# Patient Record
Sex: Male | Born: 1970 | Race: White | Hispanic: No | Marital: Single | Smoking: Never smoker
Health system: Southern US, Community
[De-identification: ages and names within clinical notes are randomized; demographics above are authoritative.]

## PROBLEM LIST (undated history)

## (undated) DIAGNOSIS — B372 Candidiasis of skin and nail: Secondary | ICD-10-CM

## (undated) DIAGNOSIS — K219 Gastro-esophageal reflux disease without esophagitis: Secondary | ICD-10-CM

## (undated) DIAGNOSIS — F840 Autistic disorder: Secondary | ICD-10-CM

## (undated) DIAGNOSIS — L700 Acne vulgaris: Secondary | ICD-10-CM

## (undated) DIAGNOSIS — L853 Xerosis cutis: Secondary | ICD-10-CM

## (undated) DIAGNOSIS — B356 Tinea cruris: Secondary | ICD-10-CM

## (undated) DIAGNOSIS — I959 Hypotension, unspecified: Secondary | ICD-10-CM

## (undated) DIAGNOSIS — R066 Hiccough: Secondary | ICD-10-CM

## (undated) DIAGNOSIS — T8859XA Other complications of anesthesia, initial encounter: Secondary | ICD-10-CM

## (undated) DIAGNOSIS — I10 Essential (primary) hypertension: Secondary | ICD-10-CM

## (undated) DIAGNOSIS — F73 Profound intellectual disabilities: Secondary | ICD-10-CM

## (undated) DIAGNOSIS — H269 Unspecified cataract: Secondary | ICD-10-CM

## (undated) DIAGNOSIS — E079 Disorder of thyroid, unspecified: Secondary | ICD-10-CM

## (undated) DIAGNOSIS — E039 Hypothyroidism, unspecified: Secondary | ICD-10-CM

## (undated) HISTORY — PX: DENTAL SURGERY: SHX609

## (undated) HISTORY — PX: TONSILLECTOMY: SUR1361

---

## 1997-12-03 ENCOUNTER — Other Ambulatory Visit: Admission: RE | Admit: 1997-12-03 | Discharge: 1997-12-03 | Payer: Self-pay | Admitting: Internal Medicine

## 1998-03-24 ENCOUNTER — Other Ambulatory Visit: Admission: RE | Admit: 1998-03-24 | Discharge: 1998-03-24 | Payer: Self-pay | Admitting: Otolaryngology

## 1999-04-18 ENCOUNTER — Encounter: Payer: Self-pay | Admitting: Emergency Medicine

## 1999-04-18 ENCOUNTER — Emergency Department (HOSPITAL_COMMUNITY): Admission: EM | Admit: 1999-04-18 | Discharge: 1999-04-18 | Payer: Self-pay | Admitting: Emergency Medicine

## 2000-07-03 ENCOUNTER — Emergency Department (HOSPITAL_COMMUNITY): Admission: EM | Admit: 2000-07-03 | Discharge: 2000-07-03 | Payer: Self-pay | Admitting: Internal Medicine

## 2000-08-15 ENCOUNTER — Inpatient Hospital Stay (HOSPITAL_COMMUNITY): Admission: EM | Admit: 2000-08-15 | Discharge: 2000-08-18 | Payer: Self-pay

## 2000-08-30 ENCOUNTER — Emergency Department (HOSPITAL_COMMUNITY): Admission: EM | Admit: 2000-08-30 | Discharge: 2000-08-30 | Payer: Self-pay | Admitting: *Deleted

## 2002-07-15 ENCOUNTER — Ambulatory Visit (HOSPITAL_COMMUNITY): Admission: RE | Admit: 2002-07-15 | Discharge: 2002-07-15 | Payer: Self-pay | Admitting: Specialist

## 2002-07-15 ENCOUNTER — Encounter: Payer: Self-pay | Admitting: Specialist

## 2002-09-09 ENCOUNTER — Emergency Department (HOSPITAL_COMMUNITY): Admission: EM | Admit: 2002-09-09 | Discharge: 2002-09-09 | Payer: Self-pay | Admitting: Emergency Medicine

## 2002-10-06 ENCOUNTER — Encounter: Payer: Self-pay | Admitting: Emergency Medicine

## 2002-10-06 ENCOUNTER — Emergency Department (HOSPITAL_COMMUNITY): Admission: EM | Admit: 2002-10-06 | Discharge: 2002-10-06 | Payer: Self-pay | Admitting: Emergency Medicine

## 2004-09-06 ENCOUNTER — Ambulatory Visit: Payer: Self-pay | Admitting: Gastroenterology

## 2004-09-14 ENCOUNTER — Ambulatory Visit (HOSPITAL_COMMUNITY): Admission: RE | Admit: 2004-09-14 | Discharge: 2004-09-14 | Payer: Self-pay | Admitting: Gastroenterology

## 2006-06-07 ENCOUNTER — Emergency Department (HOSPITAL_COMMUNITY): Admission: EM | Admit: 2006-06-07 | Discharge: 2006-06-07 | Payer: Self-pay | Admitting: Emergency Medicine

## 2006-06-14 ENCOUNTER — Ambulatory Visit: Payer: Self-pay | Admitting: Gastroenterology

## 2006-06-21 ENCOUNTER — Ambulatory Visit (HOSPITAL_COMMUNITY): Admission: RE | Admit: 2006-06-21 | Discharge: 2006-06-21 | Payer: Self-pay | Admitting: Gastroenterology

## 2006-06-26 ENCOUNTER — Ambulatory Visit: Payer: Self-pay | Admitting: Gastroenterology

## 2006-06-26 ENCOUNTER — Emergency Department (HOSPITAL_COMMUNITY): Admission: EM | Admit: 2006-06-26 | Discharge: 2006-06-26 | Payer: Self-pay | Admitting: Family Medicine

## 2007-08-01 IMAGING — US US ABDOMEN COMPLETE
1 series · 14 of 25 positions shown · non-contrast
Comparison: none

CLINICAL DATA: Abdominal pain.  
 ABDOMEN ULTRASOUND:
TECHNIQUE: Complete abdominal ultrasound examination was performed including evaluation of the liver, gallbladder, bile ducts, pancreas, kidneys, spleen, IVC, and abdominal aorta.

[Series 1: unknown · 0.38mm/px · 14 of 65 slices shown]
[im 1/65]
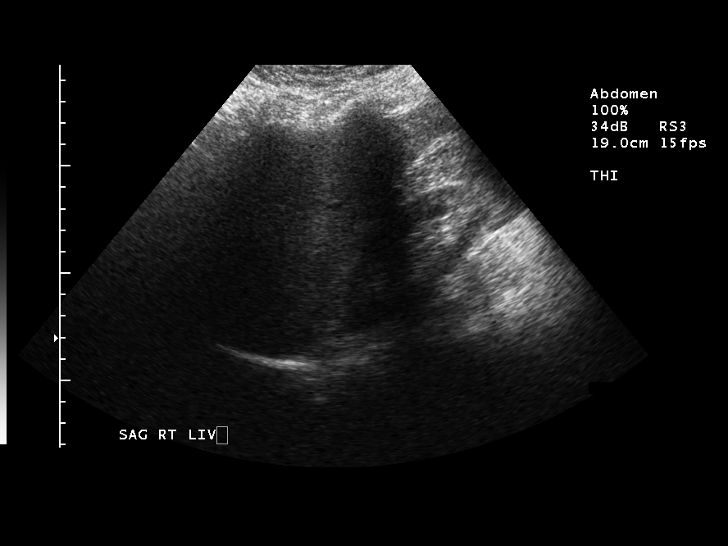
[im 6/65]
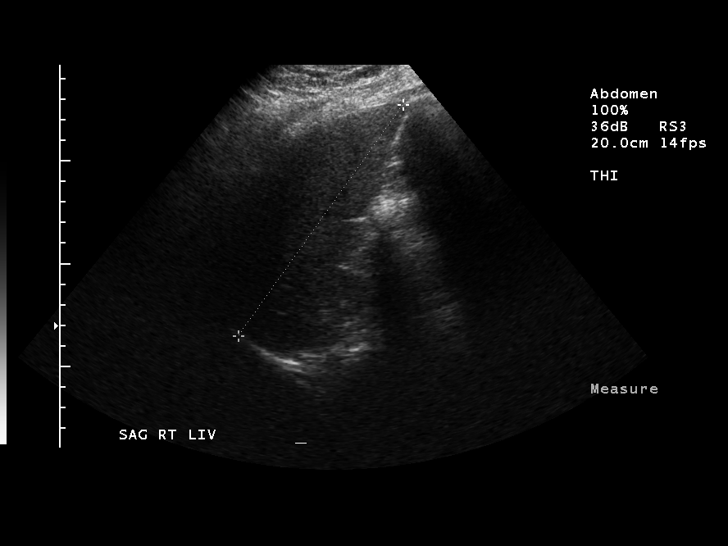
[im 11/65]
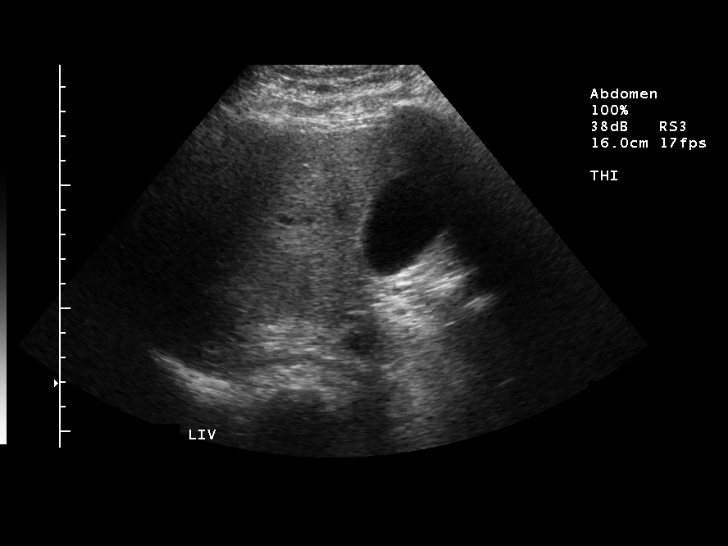
[im 17/65]
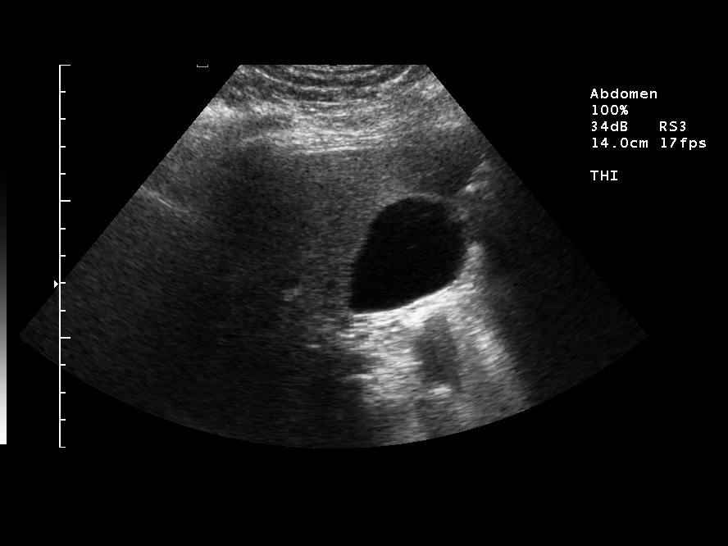
[im 22/65]
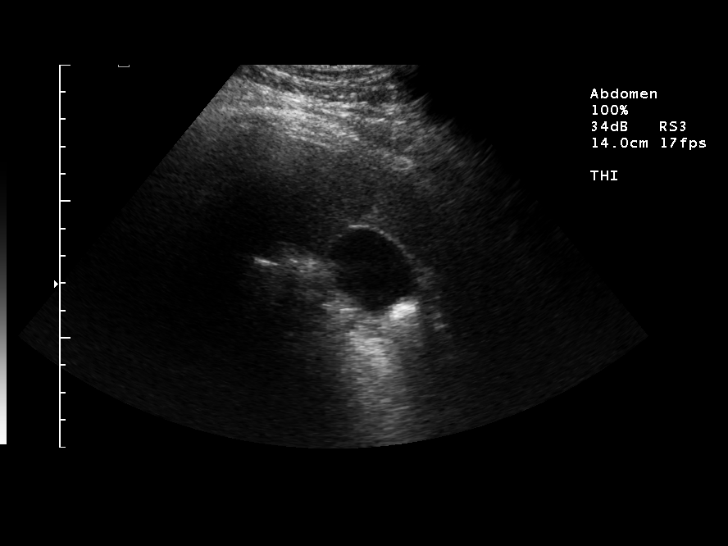
[im 25/65]
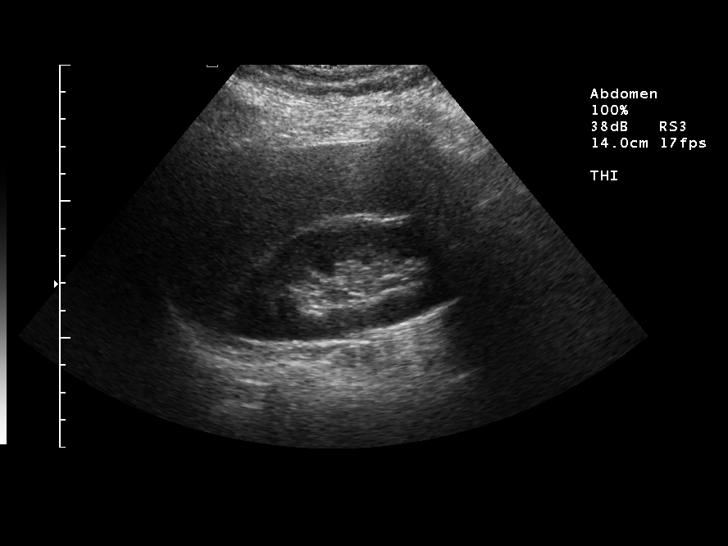
[im 30/65]
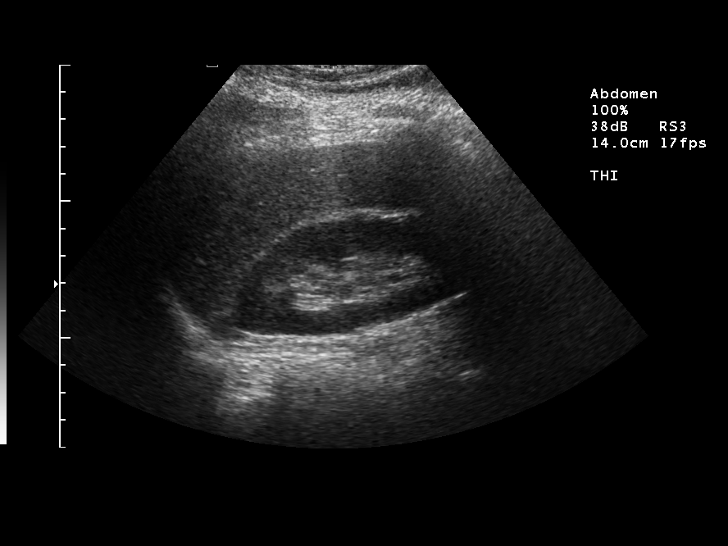
[im 35/65]
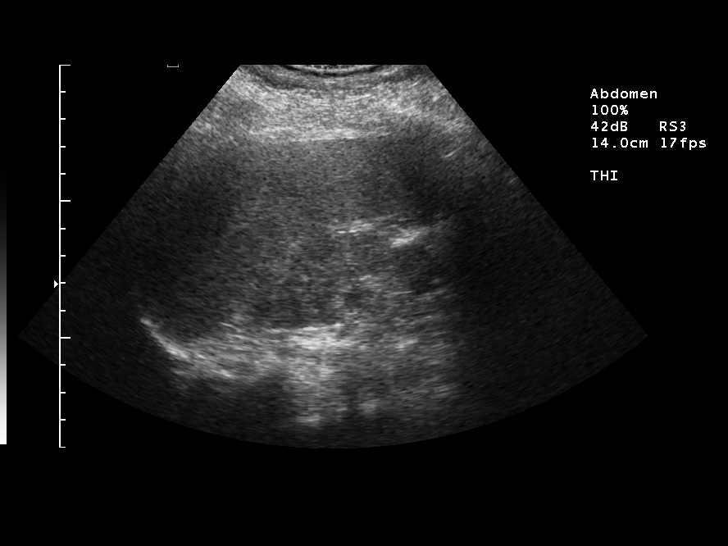
[im 41/65]
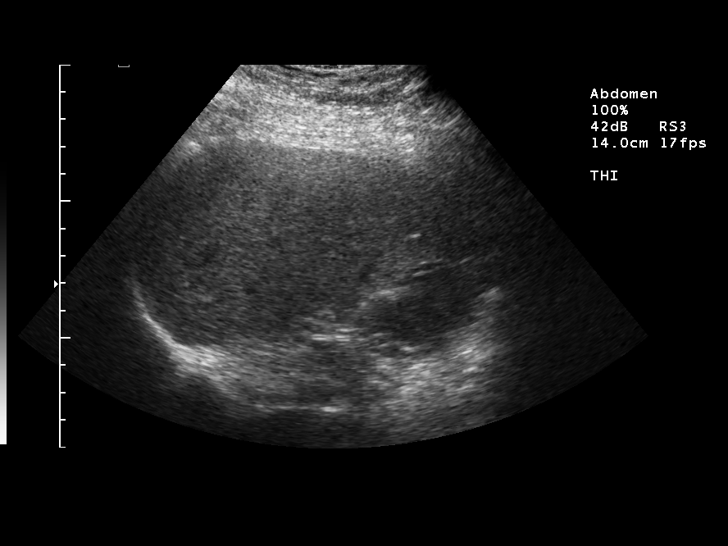
[im 43/65]
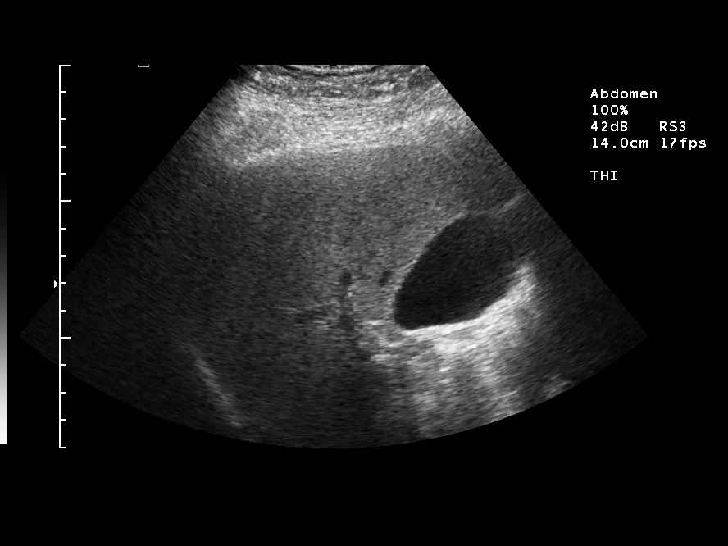
[im 49/65]
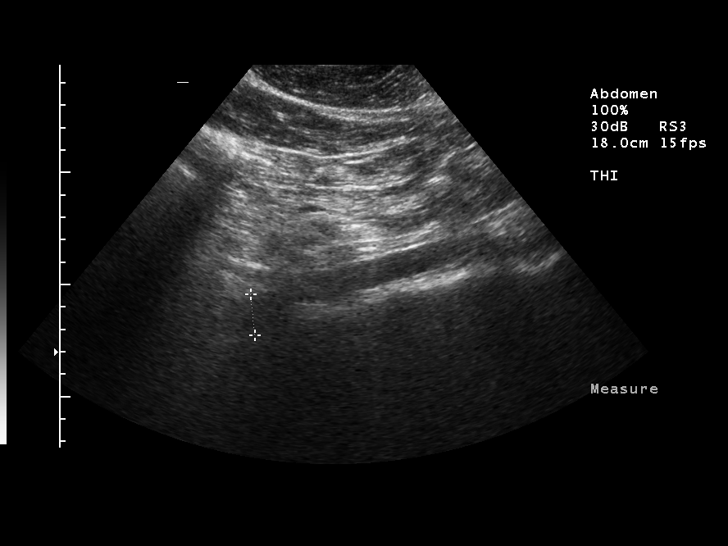
[im 54/65]
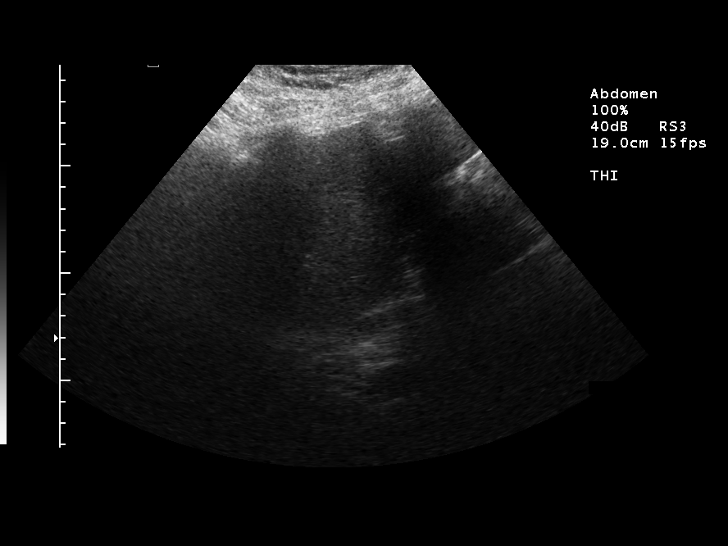
[im 59/65]
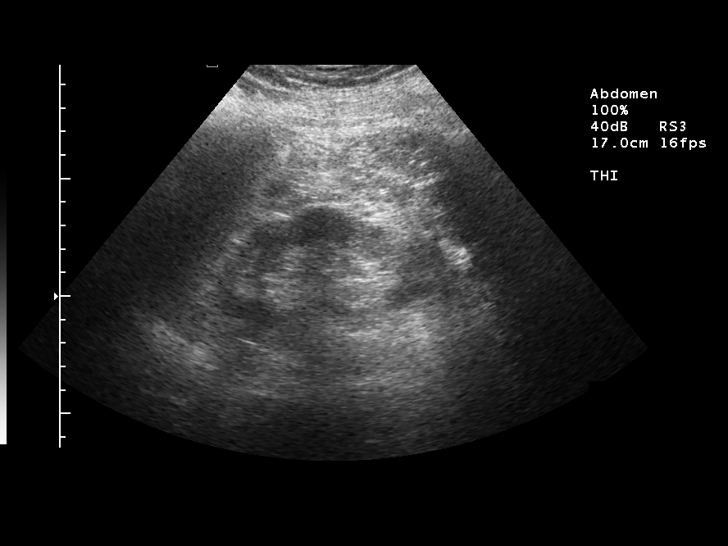
[im 65/65]
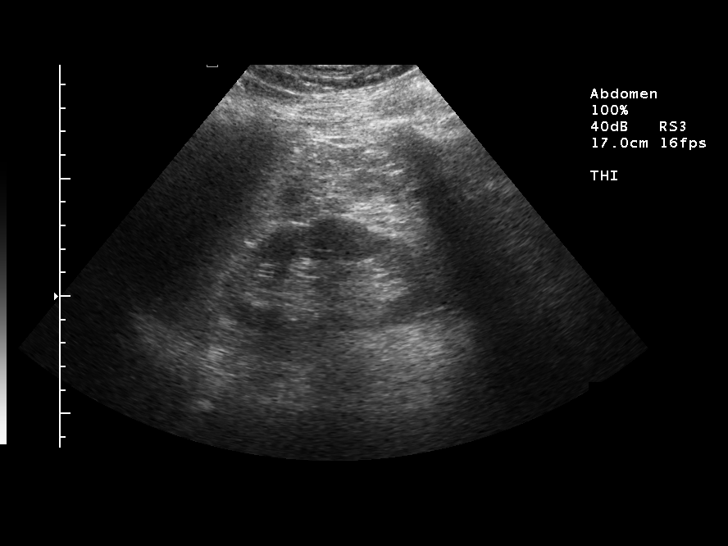

[14 of 25 positions shown; findings below may reference images not displayed]

FINDINGS: Liver, gallbladder, extrahepatic bile duct unremarkable.  Visualization of the IVC is limited.  Pancreas is not visualized.  The spleen, kidneys, and aorta are unremarkable.  No free fluid.
IMPRESSION: No acute findings.

## 2015-02-01 ENCOUNTER — Encounter (HOSPITAL_COMMUNITY): Payer: Self-pay | Admitting: Emergency Medicine

## 2015-02-01 ENCOUNTER — Emergency Department (HOSPITAL_COMMUNITY)
Admission: EM | Admit: 2015-02-01 | Discharge: 2015-02-01 | Disposition: A | Discharge status: Home / Self Care | Payer: Medicare Other | Attending: Emergency Medicine | Admitting: Emergency Medicine

## 2015-02-01 DIAGNOSIS — K219 Gastro-esophageal reflux disease without esophagitis: Secondary | ICD-10-CM | POA: Diagnosis not present

## 2015-02-01 DIAGNOSIS — R066 Hiccough: Secondary | ICD-10-CM | POA: Insufficient documentation

## 2015-02-01 DIAGNOSIS — Z8639 Personal history of other endocrine, nutritional and metabolic disease: Secondary | ICD-10-CM | POA: Insufficient documentation

## 2015-02-01 HISTORY — DX: Gastro-esophageal reflux disease without esophagitis: K21.9

## 2015-02-01 HISTORY — DX: Disorder of thyroid, unspecified: E07.9

## 2015-02-01 MED ORDER — GI COCKTAIL ~~LOC~~
30.0000 mL | Freq: Once | ORAL | Status: AC
  Administered 2015-02-01: 30 mL via ORAL
  Filled 2015-02-01: qty 30

## 2015-02-01 MED ORDER — PANTOPRAZOLE SODIUM 20 MG PO TBEC: 20.0000 mg | DELAYED_RELEASE_TABLET | Freq: Every day | ORAL | Status: DC

## 2015-02-01 MED ORDER — CHLORPROMAZINE HCL 25 MG PO TABS: 25.0000 mg | ORAL_TABLET | Freq: Three times a day (TID) | ORAL | Status: AC | PRN

## 2015-02-01 MED ORDER — SODIUM CHLORIDE 0.9 % IV SOLN
25.0000 mg | Freq: Once | INTRAVENOUS | Status: AC
  Administered 2015-02-01: 25 mg via INTRAVENOUS
  Filled 2015-02-01: qty 1

## 2015-02-01 MED ORDER — SODIUM CHLORIDE 0.9 % IV BOLUS (SEPSIS)
500.0000 mL | Freq: Once | INTRAVENOUS | Status: AC
  Administered 2015-02-01: 500 mL via INTRAVENOUS

## 2015-02-01 MED ORDER — SUCRALFATE 1 GM/10ML PO SUSP: 1.0000 g | Freq: Three times a day (TID) | ORAL | Status: DC

## 2015-02-01 MED ORDER — DIPHENHYDRAMINE HCL 50 MG/ML IJ SOLN
25.0000 mg | Freq: Once | INTRAMUSCULAR | Status: AC
  Administered 2015-02-01: 25 mg via INTRAVENOUS
  Filled 2015-02-01: qty 1

## 2015-02-01 NOTE — ED Notes (Addendum)
Hiccups have restarted. Notified Dr. Blinda Leatherwood. NAD.

## 2015-02-01 NOTE — ED Provider Notes (Signed)
CSN: [id number]     Arrival date & time 02/01/15  4098 History   First MD Initiated Contact with Patient 02/01/15 [phone]     Chief Complaint  Patient presents with  . Hiccups     (Consider location/radiation/quality/duration/timing/severity/associated sxs/prior Treatment) HPI Comments: Patient brought to the emergency department for evaluation of hiccups. Patient has had FOR one week. He did have a period last night with a hiccups stopped, but they were present again this morning. He was seen by his primary doctor yesterday and parents were told to bring him to the ER if symptoms continued.  Report that he has had 2 separate episodes of thoracoplasty approximately a week in the past. The last time was many years ago, however. At that time he had a thorough workup and was diagnosed with acid reflux. He has had nausea and vomiting yesterday, was started on Zofran by his primary doctor.   Past Medical History  Diagnosis Date  . Acid reflux   . Thyroid disease    History reviewed. No pertinent past surgical history. No family history on file. History  Substance Use Topics  . Smoking status: Never Smoker   . Smokeless tobacco: Not on file  . Alcohol Use: No    Review of Systems  Gastrointestinal:       Hiccups  All other systems reviewed and are negative.     Allergies  Review of patient's allergies indicates not on file.  Home Medications   Prior to Admission medications   Medication Sig Start Date End Date Taking? Authorizing Provider  chlorproMAZINE (THORAZINE) 25 MG tablet Take 1 tablet (25 mg total) by mouth 3 (three) times daily as needed for hiccoughs. 02/01/15   Gilda Crease, MD  pantoprazole (PROTONIX) 20 MG tablet Take 1 tablet (20 mg total) by mouth daily. 02/01/15   Gilda Crease, MD  sucralfate (CARAFATE) 1 GM/10ML suspension Take 10 mLs (1 g total) by mouth 4 (four) times daily -  with meals and at bedtime. 02/01/15   Gilda Crease, MD   BP  111/79 mmHg  Pulse 93  Temp(Src) 98.3 F (36.8 C) (Oral)  Resp 18  Ht 5\' 1"  (1.549 m)  Wt 134 lb (60.782 kg)  BMI 25.33 kg/m2  SpO2 100% Physical Exam  Constitutional: He is oriented to person, place, and time. He appears well-developed and well-nourished. No distress.  HENT:  Head: Normocephalic and atraumatic.  Right Ear: Hearing normal.  Left Ear: Hearing normal.  Nose: Nose normal.  Mouth/Throat: Oropharynx is clear and moist and mucous membranes are normal.  Eyes: Conjunctivae and EOM are normal. Pupils are equal, round, and reactive to light.  Neck: Normal range of motion. Neck supple.  Cardiovascular: Regular rhythm, S1 normal and S2 normal.  Exam reveals no gallop and no friction rub.   No murmur heard. Pulmonary/Chest: Effort normal and breath sounds normal. No respiratory distress. He exhibits no tenderness.  Abdominal: Soft. Normal appearance and bowel sounds are normal. There is no hepatosplenomegaly. There is no tenderness. There is no rebound, no guarding, no tenderness at McBurney's point and negative Murphy's sign. No hernia.  Musculoskeletal: Normal range of motion.  Neurological: He is alert and oriented to person, place, and time. He has normal strength. No cranial nerve deficit or sensory deficit. Coordination normal. GCS eye subscore is 4. GCS verbal subscore is 5. GCS motor subscore is 6.  Skin: Skin is warm, dry and intact. No rash noted. No cyanosis.  Psychiatric: He has  a normal mood and affect. His speech is normal and behavior is normal. Thought content normal.  Nursing note and vitals reviewed.   ED Course  Procedures (including critical care time) Labs Review Labs Reviewed - No data to display  Imaging Review No results found.   EKG Interpretation None      MDM   Final diagnoses:  Hiccups  Gastroesophageal reflux disease, esophagitis presence not specified    Patient presented to the emergency room for evaluation of hiccups. Patient has  had a history of intractable hiccups in the past. He has required IV Thorazine for resolution. Patient is in no distress at arrival. He is not experiencing abdominal pain, abdominal exam is benign. In the past and has been felt that his hiccups are secondary to his reflux. Patient Mister Thorazine with some relief, although he has been having intermittent episodes here in the ER, therefore will continue oral Thorazine for 2 more days. Maximize reflux treatment, follow-up with PCP.    Gilda Crease, MD 02/03/15 1515

## 2015-02-01 NOTE — ED Notes (Signed)
Mother stated, He started having hiccups for a week. Went to doctor and was sent here if continues.

## 2015-02-01 NOTE — Discharge Instructions (Signed)
Gastroesophageal Reflux Disease, Adult Gastroesophageal reflux disease (GERD) happens when acid from your stomach flows up into the esophagus. When acid comes in contact with the esophagus, the acid causes soreness (inflammation) in the esophagus. Over time, GERD may create small holes (ulcers) in the lining of the esophagus. CAUSES   Increased body weight. This puts pressure on the stomach, making acid rise from the stomach into the esophagus.  Smoking. This increases acid production in the stomach.  Drinking alcohol. This causes decreased pressure in the lower esophageal sphincter (valve or ring of muscle between the esophagus and stomach), allowing acid from the stomach into the esophagus.  Late evening meals and a full stomach. This increases pressure and acid production in the stomach.  A malformed lower esophageal sphincter. Sometimes, no cause is found. SYMPTOMS   Burning pain in the lower part of the mid-chest behind the breastbone and in the mid-stomach area. This may occur twice a week or more often.  Trouble swallowing.  Sore throat.  Dry cough.  Asthma-like symptoms including chest tightness, shortness of breath, or wheezing. DIAGNOSIS  Your caregiver may be able to diagnose GERD based on your symptoms. In some cases, X-rays and other tests may be done to check for complications or to check the condition of your stomach and esophagus. TREATMENT  Your caregiver may recommend over-the-counter or prescription medicines to help decrease acid production. Ask your caregiver before starting or adding any new medicines.  HOME CARE INSTRUCTIONS   Change the factors that you can control. Ask your caregiver for guidance concerning weight loss, quitting smoking, and alcohol consumption.  Avoid foods and drinks that make your symptoms worse, such as:  Caffeine or alcoholic drinks.  Chocolate.  Peppermint or mint flavorings.  Garlic and onions.  Spicy foods.  Citrus fruits,  such as oranges, lemons, or limes.  Tomato-based foods such as sauce, chili, salsa, and pizza.  Fried and fatty foods.  Avoid lying down for the 3 hours prior to your bedtime or prior to taking a nap.  Eat small, frequent meals instead of large meals.  Wear loose-fitting clothing. Do not wear anything tight around your waist that causes pressure on your stomach.  Raise the head of your bed 6 to 8 inches with wood blocks to help you sleep. Extra pillows will not help.  Only take over-the-counter or prescription medicines for pain, discomfort, or fever as directed by your caregiver.  Do not take aspirin, ibuprofen, or other nonsteroidal anti-inflammatory drugs (NSAIDs). SEEK IMMEDIATE MEDICAL CARE IF:   You have pain in your arms, neck, jaw, teeth, or back.  Your pain increases or changes in intensity or duration.  You develop nausea, vomiting, or sweating (diaphoresis).  You develop shortness of breath, or you faint.  Your vomit is green, yellow, black, or looks like coffee grounds or blood.  Your stool is red, bloody, or black. These symptoms could be signs of other problems, such as heart disease, gastric bleeding, or esophageal bleeding. MAKE SURE YOU:   Understand these instructions.  Will watch your condition.  Will get help right away if you are not doing well or get worse. Document Released: 03/23/2005 Document Revised: 09/05/2011 Document Reviewed: 12/31/2010 [facility] Patient Information 2015 [place], [place]. This information is not intended to replace advice given to you by your health care provider. Make sure you discuss any questions you have with your health care provider.  Hiccups A hiccup is the result of a sudden shortening of the muscle below your  lungs (diaphragm). This movement of your diaphragm is then followed by the closing of your vocal cords, which causes the hiccup sound. Most people get the hiccups. Typically, hiccups last only a short amount of  time. There are three types of hiccups:   Benign: last less than 48 hours.  Persistent: last more than 48 hours, but less than 1 month.  Intractable: last more than 1 month. A hiccup is a reflex. You cannot control reflexes. CAUSES  Causes of the hiccups can include:   Eating too much.  Drinking too much alcohol or fizzy drinks.  Eating too fast.  Eating or drinking hot and spicy foods or drinks.  Using certain medicines that have hiccupping as a side effect. Several medical conditions may also cause hiccups, including, but not limited to:  Stroke.  Gastroesophageal reflux.  Multiple sclerosis.  Traumatic brain injury.  Brain tumor.  Meningitis.  Having damage to the nerve that affects the diaphragm. Usually, though, hiccups have no apparent cause and are not the result of a serious medical condition. DIAGNOSIS  Tests may be performed to diagnose a possible condition associated with persistent or intractable hiccups. TREATMENT  Most cases of the hiccups need no treatment. None of the numerous home remedies have been proven to be effective. If your hiccups do require treatment, your treatment may include:  Medicine. Medicine may be given intravenously (by IV) or by mouth.  Hypnosis or acupuncture.  Surgery to the nerve that affects the diaphragm may be tried in severe cases. If your hiccups are caused by an underlying medical condition, treatment for the medical condition may be necessary.  HOME CARE INSTRUCTIONS   Eat small meals.  Limit alcohol intake to no more than 1 drink per day for nonpregnant women and 2 drinks per day for men. One drink equals 12 ounces of beer, 5 ounces of wine, or 1 ounces of hard liquor.  Limit drinking fizzy drinks.  Eat and chew your food slowly.  Take medicines only as directed by your health care provider. SEEK MEDICAL CARE IF:   Your hiccups last for more than 48 hours.  You are given medicine, but your hiccups do not get  better.  You cannot sleep or eat due to the hiccups.  You have unexpected weight loss due to the hiccups.  You have trouble breathing or swallowing.  You have a fever.  You develop severe pain in your abdomen.  You develop numbness, tingling, or weakness. Document Released: 08/22/2001 Document Revised: 10/28/2013 Document Reviewed: 08/04/2010 [facility] Patient Information 2015 [place], [place]. This information is not intended to replace advice given to you by your health care provider. Make sure you discuss any questions you have with your health care provider.

## 2015-03-06 ENCOUNTER — Encounter (HOSPITAL_COMMUNITY): Payer: Self-pay | Admitting: *Deleted

## 2015-03-06 ENCOUNTER — Emergency Department (HOSPITAL_COMMUNITY)
Admission: EM | Admit: 2015-03-06 | Discharge: 2015-03-07 | Disposition: A | Discharge status: Home / Self Care | Payer: Medicare Other | Attending: Emergency Medicine | Admitting: Emergency Medicine

## 2015-03-06 DIAGNOSIS — Z7952 Long term (current) use of systemic steroids: Secondary | ICD-10-CM | POA: Diagnosis not present

## 2015-03-06 DIAGNOSIS — Z79899 Other long term (current) drug therapy: Secondary | ICD-10-CM | POA: Diagnosis not present

## 2015-03-06 DIAGNOSIS — K219 Gastro-esophageal reflux disease without esophagitis: Secondary | ICD-10-CM | POA: Diagnosis not present

## 2015-03-06 DIAGNOSIS — E079 Disorder of thyroid, unspecified: Secondary | ICD-10-CM | POA: Diagnosis not present

## 2015-03-06 DIAGNOSIS — R066 Hiccough: Secondary | ICD-10-CM | POA: Diagnosis not present

## 2015-03-06 DIAGNOSIS — R109 Unspecified abdominal pain: Secondary | ICD-10-CM | POA: Diagnosis not present

## 2015-03-06 MED ORDER — SODIUM CHLORIDE 0.9 % IV SOLN
25.0000 mg | Freq: Once | INTRAVENOUS | Status: AC
  Administered 2015-03-07: 25 mg via INTRAVENOUS
  Filled 2015-03-06: qty 1

## 2015-03-06 MED ORDER — DIPHENHYDRAMINE HCL 50 MG/ML IJ SOLN
25.0000 mg | Freq: Once | INTRAMUSCULAR | Status: AC
  Administered 2015-03-07: 25 mg via INTRAVENOUS
  Filled 2015-03-06: qty 1

## 2015-03-06 MED ORDER — GI COCKTAIL ~~LOC~~
30.0000 mL | Freq: Once | ORAL | Status: AC
  Administered 2015-03-06: 30 mL via ORAL
  Filled 2015-03-06: qty 30

## 2015-03-06 MED ORDER — SODIUM CHLORIDE 0.9 % IV BOLUS (SEPSIS)
500.0000 mL | Freq: Once | INTRAVENOUS | Status: AC
  Administered 2015-03-07: 500 mL via INTRAVENOUS

## 2015-03-06 NOTE — ED Provider Notes (Signed)
CSN: [id number]     Arrival date & time 03/06/15  1701 History   First MD Initiated Contact with Patient 03/06/15 2154     Chief Complaint  Patient presents with  . Hiccups   Justin Johnson is a 44 y.o. male with a history of MR, acid reflux he presents to the emergency department with his caretaker reports he has had hiccups for the past 5 days. They report that he has had hiccups in the past due to acid reflux. The patient was seen last month in the emergency department and received fluids, Benadryl, Thorazine and a GI cocktail with relief of his hiccups. The caretaker reports he has been taking his acid reflux medicine. The patient denies any complaints currently. Caretaker reports he is complaining of abdominal pain earlier. The caretaker reports that he vomited once yesterday with hiccups. The patient denies chest pain, nausea, vomiting, fevers or abdominal pain currently.  (Consider location/radiation/quality/duration/timing/severity/associated sxs/prior Treatment) HPI  Past Medical History  Diagnosis Date  . Acid reflux   . Thyroid disease    History reviewed. No pertinent past surgical history. History reviewed. No pertinent family history. Social History  Substance Use Topics  . Smoking status: Never Smoker   . Smokeless tobacco: None  . Alcohol Use: No    Review of Systems  Constitutional: Negative for fever.  Respiratory: Negative for cough and shortness of breath.   Gastrointestinal: Positive for abdominal pain. Negative for nausea, vomiting and diarrhea.       Hiccups  Skin: Negative for rash.      Allergies  Review of patient's allergies indicates no known allergies.  Home Medications   Prior to Admission medications   Medication Sig Start Date End Date Taking? Authorizing Provider  ARIPiprazole (ABILIFY) 2 MG tablet Take 2 mg by mouth daily.   Yes Historical Provider, MD  clindamycin (CLEOCIN T) 1 % lotion Apply 1 application topically every Monday,  Wednesday, and Friday.   Yes Historical Provider, MD  dimenhyDRINATE (DRAMAMINE) 50 MG tablet Take 50 mg by mouth every 8 (eight) hours as needed for itching or nausea.   Yes Historical Provider, MD  ergocalciferol (VITAMIN D2) 50000 UNITS capsule Take 50,000 Units by mouth every 30 (thirty) days.   Yes Historical Provider, MD  ketoconazole (NIZORAL) 2 % shampoo Apply 1 application topically 3 (three) times daily as needed for irritation.   Yes Historical Provider, MD  lactulose (CHRONULAC) 10 GM/15ML solution Take 10 g by mouth daily.   Yes Historical Provider, MD  levothyroxine (SYNTHROID, LEVOTHROID) 50 MCG tablet Take 50 mcg by mouth daily before breakfast.   Yes Historical Provider, MD  mometasone (ELOCON) 0.1 % cream Apply 1 application topically daily.   Yes Historical Provider, MD  NIFEdipine (PROCARDIA) 20 MG capsule Take 20 mg by mouth daily.   Yes Historical Provider, MD  pantoprazole (PROTONIX) 20 MG tablet Take 1 tablet (20 mg total) by mouth daily. 02/01/15  Yes Gilda Crease, MD  ranitidine (ZANTAC) 150 MG tablet Take 150 mg by mouth 2 (two) times daily.   Yes Historical Provider, MD  sucralfate (CARAFATE) 1 GM/10ML suspension Take 10 mLs (1 g total) by mouth 4 (four) times daily -  with meals and at bedtime. 02/01/15  Yes Gilda Crease, MD  chlorproMAZINE (THORAZINE) 25 MG tablet Take 1 tablet (25 mg total) by mouth 3 (three) times daily as needed for hiccoughs. Patient not taking: Reported on 03/06/2015 02/01/15   Gilda Crease, MD  omeprazole (  PRILOSEC) 20 MG capsule Take 1 capsule (20 mg total) by mouth daily. 03/07/15   Everlene Farrier, PA-C   BP 142/87 mmHg  Pulse 98  Temp(Src) 98.1 F (36.7 C) (Oral)  Resp 16  SpO2 99% Physical Exam  Constitutional: He appears well-developed and well-nourished. No distress.  Nontoxic appearing.  HENT:  Head: Normocephalic and atraumatic.  Mouth/Throat: Oropharynx is clear and moist.  Eyes: Conjunctivae are normal. Pupils  are equal, round, and reactive to light. Right eye exhibits no discharge. Left eye exhibits no discharge.  Neck: Neck supple.  Cardiovascular: Normal rate, regular rhythm, normal heart sounds and intact distal pulses.  Exam reveals no gallop and no friction rub.   No murmur heard. Pulmonary/Chest: Effort normal and breath sounds normal. No respiratory distress. He has no wheezes. He has no rales.  Lungs are clear to auscultation bilaterally.  Abdominal: Soft. Bowel sounds are normal. He exhibits no distension. There is no tenderness. There is no guarding.  Abdomen is soft and nontender to palpation. Patient is hiccuping during the exam.  Musculoskeletal: He exhibits no edema.  Lymphadenopathy:    He has no cervical adenopathy.  Neurological: He is alert. Coordination normal.  Skin: Skin is warm and dry. No rash noted. He is not diaphoretic. No erythema. No pallor.  Psychiatric: He has a normal mood and affect. His behavior is normal.  Patient has mental retardation.   Nursing note and vitals reviewed.   ED Course  Procedures (including critical care time) Labs Review Labs Reviewed - No data to display  Imaging Review No results found.    EKG Interpretation None      Filed Vitals:   03/06/15 1727 03/06/15 2043 03/07/15 0021  BP: 141/87 124/82 142/87  Pulse: 109 87 98  Temp: 98.4 F (36.9 C)  98.1 F (36.7 C)  TempSrc: Oral  Oral  Resp: 16 18 16   SpO2: 100% 100% 99%     MDM   Meds given in ED:  Medications  chlorproMAZINE (THORAZINE) injection 50 mg (not administered)  gi cocktail (Maalox,Lidocaine,Donnatal) (30 mLs Oral Given 03/06/15 2234)  sodium chloride 0.9 % bolus 500 mL (0 mLs Intravenous Stopped 03/07/15 0226)  chlorproMAZINE (THORAZINE) 25 mg in sodium chloride 0.9 % 25 mL IVPB (25 mg Intravenous Given 03/07/15 0032)  diphenhydrAMINE (BENADRYL) injection 25 mg (25 mg Intravenous Given 03/07/15 0018)  baclofen (LIORESAL) tablet 5 mg (5 mg Oral Given 03/07/15 0223)   diphenhydrAMINE (BENADRYL) capsule 25 mg (25 mg Oral Given 03/07/15 0223)    New Prescriptions   OMEPRAZOLE (PRILOSEC) 20 MG CAPSULE    Take 1 capsule (20 mg total) by mouth daily.    Final diagnoses:  Hiccups    This  is a 44 y.o. male with a history of MR, acid reflux he presents to the emergency department with his caretaker reports he has had hiccups for the past 5 days. They report that he has had hiccups in the past due to acid reflux. The patient was seen last month in the emergency department and received fluids, Benadryl, Thorazine and a GI cocktail with relief of his hiccups. The patient has not been taking omeprazole recently. On exam the patient is afebrile nontoxic appearing. His abdomen is soft and nontender to palpation. The patient has mental retardation and has difficulty following commands to attempt physical maneuvers to stop the hiccups. He is tolerated ginger ale and liquids in the emergency department without vomiting. Patient given GI cocktail initially in the emergency department with  relief of his calves are approximately 10 minutes. Patient was given Thorazine 25 mg with Benadryl and fluid bolus- there was no success with this. Patient given baclofen then without relief. Will attempt one more dose of thorazine and reevaluate. I advised to the patient and caretaker that if this did not work he would be discharged and need to follow-up with his primary care provider. I encouraged that he stay on omeprazole for his acid reflux.  At shift change patient care was handed off to Dr. Rhunette Croft who will disposition the patient after he receives his last dose of Thorazine. The caretaker is in agreement and understanding of the plan of discharge after last dose of Thorazine. Advised to return to the emergency department with new or worsening symptoms or new concerns. The patient's caretaker verbalize understanding and agreement with plan.  This patient was discussed with Dr. Dalene Seltzer  and Rhunette Croft who agree with assessment and plan.    Everlene Farrier, PA-C 03/07/15 1308  Alvira Monday, MD 03/10/15 Windell Moment

## 2015-03-06 NOTE — ED Notes (Signed)
Pt was seen in august for same and given medications and iv fluids to help with hiccups. Pt lives in group home, staff reports pt having hiccups again for at least one week and had n/v yesterday.

## 2015-03-06 NOTE — ED Notes (Signed)
Got pt up and walked him around, sat back down and tried holding his breath..... Pt no longer has hiccups.

## 2015-03-07 DIAGNOSIS — R066 Hiccough: Secondary | ICD-10-CM | POA: Diagnosis not present

## 2015-03-07 MED ORDER — DIPHENHYDRAMINE HCL 25 MG PO CAPS
25.0000 mg | ORAL_CAPSULE | Freq: Once | ORAL | Status: AC
  Administered 2015-03-07: 25 mg via ORAL
  Filled 2015-03-07: qty 1

## 2015-03-07 MED ORDER — BACLOFEN 5 MG HALF TABLET
5.0000 mg | ORAL_TABLET | Freq: Once | ORAL | Status: AC
  Administered 2015-03-07: 5 mg via ORAL
  Filled 2015-03-07: qty 1

## 2015-03-07 MED ORDER — SODIUM CHLORIDE 0.9 % IV SOLN: 50.0000 mg | Freq: Once | INTRAVENOUS | Status: DC

## 2015-03-07 MED ORDER — OMEPRAZOLE 20 MG PO CPDR: 20.0000 mg | DELAYED_RELEASE_CAPSULE | Freq: Every day | ORAL | Status: AC

## 2015-03-07 MED ORDER — CHLORPROMAZINE HCL 25 MG/ML IJ SOLN
50.0000 mg | Freq: Once | INTRAMUSCULAR | Status: AC
  Administered 2015-03-07: 50 mg via INTRAMUSCULAR
  Filled 2015-03-07: qty 2

## 2015-03-07 NOTE — ED Notes (Signed)
Pt's caregiver Judeth Cornfield from group home verbalized understanding of d/c instructions and has no further questions. Pt stable and NAD. Pt no longer has hiccups.

## 2015-03-07 NOTE — Discharge Instructions (Signed)
Hiccups A hiccup is the result of a sudden shortening of the muscle below your lungs (diaphragm). This movement of your diaphragm is then followed by the closing of your vocal cords, which causes the hiccup sound. Most people get the hiccups. Typically, hiccups last only a short amount of time. There are three types of hiccups:   Benign: last less than 48 hours.  Persistent: last more than 48 hours, but less than 1 month.  Intractable: last more than 1 month. A hiccup is a reflex. You cannot control reflexes. CAUSES  Causes of the hiccups can include:   Eating too much.  Drinking too much alcohol or fizzy drinks.  Eating too fast.  Eating or drinking hot and spicy foods or drinks.  Using certain medicines that have hiccupping as a side effect. Several medical conditions may also cause hiccups, including, but not limited to:  Stroke.  Gastroesophageal reflux.  Multiple sclerosis.  Traumatic brain injury.  Brain tumor.  Meningitis.  Having damage to the nerve that affects the diaphragm. Usually, though, hiccups have no apparent cause and are not the result of a serious medical condition. DIAGNOSIS  Tests may be performed to diagnose a possible condition associated with persistent or intractable hiccups. TREATMENT  Most cases of the hiccups need no treatment. None of the numerous home remedies have been proven to be effective. If your hiccups do require treatment, your treatment may include:  Medicine. Medicine may be given intravenously (by IV) or by mouth.  Hypnosis or acupuncture.  Surgery to the nerve that affects the diaphragm may be tried in severe cases. If your hiccups are caused by an underlying medical condition, treatment for the medical condition may be necessary.  HOME CARE INSTRUCTIONS   Eat small meals.  Limit alcohol intake to no more than 1 drink per day for nonpregnant women and 2 drinks per day for men. One drink equals 12 ounces of beer, 5 ounces  of wine, or 1 ounces of hard liquor.  Limit drinking fizzy drinks.  Eat and chew your food slowly.  Take medicines only as directed by your health care provider. SEEK MEDICAL CARE IF:   Your hiccups last for more than 48 hours.  You are given medicine, but your hiccups do not get better.  You cannot sleep or eat due to the hiccups.  You have unexpected weight loss due to the hiccups.  You have trouble breathing or swallowing.  You have a fever.  You develop severe pain in your abdomen.  You develop numbness, tingling, or weakness. Document Released: 08/22/2001 Document Revised: 10/28/2013 Document Reviewed: 08/04/2010 [employer] Patient Information 2015 [employer]. This information is not intended to replace advice given to you by your health care provider. Make sure you discuss any questions you have with your health care provider.  

## 2015-03-07 NOTE — ED Notes (Signed)
Pt started back having hiccups

## 2015-03-07 NOTE — ED Notes (Signed)
Pt still has hiccups, in room to administer thorazine IM

## 2015-04-11 ENCOUNTER — Encounter (HOSPITAL_COMMUNITY): Payer: Self-pay

## 2015-04-11 ENCOUNTER — Emergency Department (HOSPITAL_COMMUNITY)
Admission: EM | Admit: 2015-04-11 | Discharge: 2015-04-11 | Disposition: A | Discharge status: Home / Self Care | Payer: Medicare Other | Attending: Emergency Medicine | Admitting: Emergency Medicine

## 2015-04-11 DIAGNOSIS — E079 Disorder of thyroid, unspecified: Secondary | ICD-10-CM | POA: Insufficient documentation

## 2015-04-11 DIAGNOSIS — Z79899 Other long term (current) drug therapy: Secondary | ICD-10-CM | POA: Insufficient documentation

## 2015-04-11 DIAGNOSIS — F79 Unspecified intellectual disabilities: Secondary | ICD-10-CM | POA: Diagnosis not present

## 2015-04-11 DIAGNOSIS — R000 Tachycardia, unspecified: Secondary | ICD-10-CM | POA: Insufficient documentation

## 2015-04-11 DIAGNOSIS — R066 Hiccough: Secondary | ICD-10-CM | POA: Diagnosis not present

## 2015-04-11 DIAGNOSIS — K219 Gastro-esophageal reflux disease without esophagitis: Secondary | ICD-10-CM | POA: Insufficient documentation

## 2015-04-11 HISTORY — DX: Hiccough: R06.6

## 2015-04-11 MED ORDER — GI COCKTAIL ~~LOC~~
30.0000 mL | Freq: Once | ORAL | Status: AC
  Administered 2015-04-11: 30 mL via ORAL
  Filled 2015-04-11: qty 30

## 2015-04-11 MED ORDER — CHLORPROMAZINE HCL 25 MG PO TABS
50.0000 mg | ORAL_TABLET | Freq: Once | ORAL | Status: AC
  Administered 2015-04-11: 50 mg via ORAL
  Filled 2015-04-11: qty 2

## 2015-04-11 MED ORDER — HALOPERIDOL LACTATE 5 MG/ML IJ SOLN
2.0000 mg | Freq: Once | INTRAMUSCULAR | Status: AC
  Administered 2015-04-11: 2 mg via INTRAMUSCULAR
  Filled 2015-04-11: qty 1

## 2015-04-11 MED ORDER — METOCLOPRAMIDE HCL 10 MG PO TABS: 10.0000 mg | ORAL_TABLET | Freq: Four times a day (QID) | ORAL | Status: DC

## 2015-04-11 NOTE — Discharge Instructions (Signed)
You May try to start taking the Reglan this evening for the hiccups. Please follow-up with your doctor for further evaluation and management of your symptoms.  Hiccups A hiccup is the result of a sudden shortening of the muscle below your lungs (diaphragm). This movement of your diaphragm causes a sudden inhalation followed by the closing of your vocal cords, which causes the hiccup sound. Most people get the hiccups. Typically, hiccups last only a short amount of time.  There are three types of hiccups:   Benign. These hiccups last less than 48 hours.   Persistent. These hiccups last more than 48 hours, but less than 1 month.   Intractable. These hiccups last more than 1 month.  A hiccup is a reflex. You cannot control reflexes.  HOME CARE INSTRUCTIONS  Watch your hiccups for any changes. The following actions may help to lessen any discomfort that you are feeling:  Eat small meals.   Limit alcohol intake to no more than 1 drink per day for nonpregnant women and 2 drinks per day for men. One drink equals 12 oz of beer, 5 oz of wine, or 1 oz of hard liquor.  Limit drinking carbonated or fizzy drinks, such as soda.  Eat and chew your food slowly.   Avoid eating or drinking hot or spicy foods and drinks.  Take medicines only as directed by your health care provider.  SEEK MEDICAL CARE IF:   Your hiccups last for more than 48 hours.   Your hiccups do not improve with treatment.  You cannot sleep or eat due to the hiccups.   You have unexpected weight loss due to the hiccups.   You have a fever.   You have trouble breathing or swallowing.   You develop severe pain in your abdomen.  You develop numbness, tingling, or weakness.   This information is not intended to replace advice given to you by your health care provider. Make sure you discuss any questions you have with your health care provider.   Document Released: 08/22/2001 Document Revised: 10/28/2014  Document Reviewed: 06/09/2014 Elsevier Interactive Patient Education [employer]2016 Elsevier Inc.

## 2015-04-11 NOTE — ED Notes (Signed)
Pt given ice water. Pt calm. Still has hiccups.

## 2015-04-11 NOTE — ED Notes (Signed)
Awake. Verbally responsive. A/O x4. Resp even and unlabored. No audible adventitious breath sounds noted. ABC's intact. Family at bedside. 

## 2015-04-11 NOTE — ED Notes (Signed)
He is here with his parents.  They tell me pt. Has had intermittent issues with hiccups since 2002.  This time, he has had hiccups since August of this year which are recalcitrant to Thorazine.  He is in no distress.

## 2015-04-11 NOTE — ED Notes (Signed)
PA at bedside.

## 2015-04-11 NOTE — ED Provider Notes (Signed)
CSN: [id number]     Arrival date & time 04/11/15  1243 History   First MD Initiated Contact with Patient 04/11/15 1304     Chief Complaint  Patient presents with  . Hiccups     (Consider location/radiation/quality/duration/timing/severity/associated sxs/prior Treatment) HPI Justin Johnson is a 44 y.o. male with a history of mental retardation, as a reflux, comes in for evaluation of hiccups. Patient has had this problem intermittently since 2002. Patient is accompanied by parents who contribute history of present illness. Most recent episode of hiccups in September, followed by PCP for this problem. Patient is taking Thorazine at home. Symptoms apparently are not responding to Thorazine treatment now. They deny any other new medical problems or other symptoms. No cough, nausea or vomiting, diarrhea, constipation, abdominal pain, fevers.  Past Medical History  Diagnosis Date  . Acid reflux   . Thyroid disease   . Hiccoughs    No past surgical history on file. No family history on file. Social History  Substance Use Topics  . Smoking status: Never Smoker   . Smokeless tobacco: None  . Alcohol Use: No    Review of Systems A 10 point review of systems was completed and was negative except for pertinent positives and negatives as mentioned in the history of present illness     Allergies  Bee venom  Home Medications   Prior to Admission medications   Medication Sig Start Date End Date Taking? Authorizing Provider  ARIPiprazole (ABILIFY) 2 MG tablet Take 2 mg by mouth 2 (two) times daily.    Yes Historical Provider, MD  chlorproMAZINE (THORAZINE) 25 MG tablet Take 1 tablet (25 mg total) by mouth 3 (three) times daily as needed for hiccoughs. 02/01/15  Yes Gilda Creasehristopher J Pollina, MD  clindamycin (CLEOCIN T) 1 % lotion Apply 1 application topically every Monday, Wednesday, and Friday.   Yes Historical Provider, MD  dimenhyDRINATE (DRAMAMINE) 50 MG tablet Take 50 mg by mouth every 8  (eight) hours as needed for itching or nausea.   Yes Historical Provider, MD  ergocalciferol (VITAMIN D2) 50000 UNITS capsule Take 50,000 Units by mouth every 30 (thirty) days. On the 6th   Yes Historical Provider, MD  ketoconazole (NIZORAL) 2 % shampoo Apply 1 application topically 3 (three) times daily as needed for irritation.   Yes Historical Provider, MD  lactulose (CHRONULAC) 10 GM/15ML solution Take 10 g by mouth daily.   Yes Historical Provider, MD  levothyroxine (SYNTHROID, LEVOTHROID) 50 MCG tablet Take 50 mcg by mouth daily before breakfast.   Yes Historical Provider, MD  mometasone (ELOCON) 0.1 % cream Apply 1 application topically daily.   Yes Historical Provider, MD  NIFEdipine (PROCARDIA) 20 MG capsule Take 20 mg by mouth 3 (three) times daily.   Yes Historical Provider, MD  omeprazole (PRILOSEC) 20 MG capsule Take 1 capsule (20 mg total) by mouth daily. 03/07/15  Yes Everlene FarrierWilliam Dansie, PA-C  triazolam (HALCION) 0.125 MG tablet Take 0.375 mg by mouth as needed for sleep. For dental procedures   Yes Historical Provider, MD  metoCLOPramide (REGLAN) 10 MG tablet Take 1 tablet (10 mg total) by mouth every 6 (six) hours. 04/11/15   Joycie PeekBenjamin Kanasia Gayman, PA-C  pantoprazole (PROTONIX) 20 MG tablet Take 1 tablet (20 mg total) by mouth daily. Patient not taking: Reported on 04/11/2015 02/01/15   Gilda Creasehristopher J Pollina, MD  sucralfate (CARAFATE) 1 GM/10ML suspension Take 10 mLs (1 g total) by mouth 4 (four) times daily -  with meals and at  bedtime. Patient not taking: Reported on 04/11/2015 02/01/15   Gilda Crease, MD   BP 113/61 mmHg  Pulse 96  Resp 16  SpO2 100% Physical Exam  Constitutional: He is oriented to person, place, and time. He appears well-developed and well-nourished.  HENT:  Head: Normocephalic and atraumatic.  Mouth/Throat: Oropharynx is clear and moist.  Eyes: Conjunctivae are normal. Pupils are equal, round, and reactive to light. Right eye exhibits no discharge. Left eye  exhibits no discharge. No scleral icterus.  Neck: Neck supple.  Cardiovascular: Regular rhythm and normal heart sounds.   Mild tachycardia  Pulmonary/Chest: Effort normal and breath sounds normal. No respiratory distress. He has no wheezes. He has no rales.  Actively hiccuping  Abdominal: Soft. There is no tenderness.  Musculoskeletal: He exhibits no tenderness.  Neurological: He is alert and oriented to person, place, and time.  Cranial Nerves II-XII grossly intact  Skin: Skin is warm and dry. No rash noted.  Psychiatric: He has a normal mood and affect.  Evidence of mental retardation  Nursing note and vitals reviewed.   ED Course  Procedures (including critical care time) Labs Review Labs Reviewed - No data to display  Imaging Review No results found. I have personally reviewed and evaluated these images and lab results as part of my medical decision-making.   EKG Interpretation   Date/Time:  Saturday April 11 2015 14:54:20 EDT Ventricular Rate:  134 PR Interval:  132 QRS Duration: 81 QT Interval:  299 QTC Calculation: 446 R Axis:   -48 Text Interpretation:  Sinus tachycardia LAD, consider left anterior  fascicular block Baseline wander in lead(s) V6 ED PHYSICIAN INTERPRETATION  AVAILABLE IN [facility] Confirmed by TEST, Record ([id number]) on  04/12/2015 8:08:12 AM     Meds given in ED:  Medications  chlorproMAZINE (THORAZINE) tablet 50 mg (50 mg Oral Given 04/11/15 1339)  gi cocktail (Maalox,Lidocaine,Donnatal) (30 mLs Oral Given 04/11/15 1339)  haloperidol lactate (HALDOL) injection 2 mg (2 mg Intramuscular Given 04/11/15 1518)    Discharge Medication List as of 04/11/2015  3:52 PM    START taking these medications   Details  metoCLOPramide (REGLAN) 10 MG tablet Take 1 tablet (10 mg total) by mouth every 6 (six) hours., Starting 04/11/2015, Until Discontinued, Print       Filed Vitals:   04/11/15 1257 04/11/15 1400 04/11/15 1601 04/11/15 1616  BP:   98/69 113/61   Pulse:  122 125 96  Resp:  18 16   SpO2: 100% 100% 100%     MDM  Justin Johnson is a 44 y.o. male with a history of MR and chronic hiccups. Patient has had hiccups since 2002. He uses Thorazine at home as needed. His hiccups were not responsive to this medication.  On arrival he is hemodynamically stable. He was tachycardic. His mom reports that he is very anxious in hospitals and fast heart rates are not unusual. He received 1 dose of Thorazine 50 mg in the ED as well as a GI cocktail that has worked for him in the past, but did not work today. He was treated with 2 mg of Haldol after EKG, which was also unsuccessful. Discussed patient will need to follow-up with PCP. Will discharge with metoclopramide. Prior to patient discharge, I personally rechecked his heart rate after giving him some ice water. Heart rate was 90s. Overall, patient appears well, nontoxic with stable vital signs and is appropriate for discharge. Final diagnoses:  Intractable hiccups  Joycie Peek, PA-C 04/12/15 1404  Lavera Guise, MD 04/12/15 [phone]

## 2015-04-11 NOTE — ED Notes (Addendum)
Awake. Verbally responsive. A/O x4. Resp even and unlabored. No audible adventitious breath sounds noted. ABC's intact. Pt continues to have hiccups without improvement noted.

## 2015-04-11 NOTE — ED Notes (Addendum)
Family reported that pts has hiccups and given Thorazine without improvement. Family denies any n/v/d and abd pain. Abd soft/nondistended/nontender to palpate. BS (+) and active x4 quadrants.

## 2017-04-18 ENCOUNTER — Ambulatory Visit: Payer: Self-pay | Admitting: Dentistry

## 2017-04-21 NOTE — Pre-Procedure Instructions (Signed)
Justin Johnson  04/21/2017     No Pharmacies Listed   Your procedure is scheduled on October 31  [facility] at 1000 A.M.  Call this number if you have problems the morning of surgery:  [phone]   Remember:  Do not eat food or drink liquids after midnight.  Continue all other medications as directed by your physician except follow these medication instructions before surgery   Take these medicines the morning of surgery with A SIP OF WATER  ARIPiprazole (ABILIFY) levothyroxine (SYNTHROID, LEVOTHROID)  7 days prior to surgery STOP taking any Aspirin (unless otherwise instructed by your surgeon), Aleve, Naproxen, Ibuprofen, Motrin, Advil, Goody's, BC's, all herbal medications, fish oil, and all vitamins     Do not wear jewelry  Do not wear lotions, powders, or cologne, or deoderant.  Men may shave face and neck.  Do not bring valuables to the hospital.  [facility] is not responsible for any belongings or valuables.  Contacts, dentures or bridgework may not be worn into surgery.  Leave your suitcase in the car.  After surgery it may be brought to your room.  For patients admitted to the hospital, discharge time will be determined by your treatment team.  Patients discharged the day of surgery will not be allowed to drive home.    Special instructions:   [place]- Preparing For Surgery  Before surgery, you can play an important role. Because skin is not sterile, your skin needs to be as free of germs as possible. You can reduce the number of germs on your skin by washing with CHG (chlorahexidine gluconate) Soap before surgery.  CHG is an antiseptic cleaner which kills germs and bonds with the skin to continue killing germs even after washing.  Please do not use if you have an allergy to CHG or antibacterial soaps. If your skin becomes reddened/irritated stop using the CHG.  Do not shave (including legs and underarms) for at least 48  hours prior to first CHG shower. It is OK to shave your face.  Please follow these instructions carefully.   1. Shower the NIGHT BEFORE SURGERY and the MORNING OF SURGERY with CHG.   2. If you chose to wash your hair, wash your hair first as usual with your normal shampoo.  3. After you shampoo, rinse your hair and body thoroughly to remove the shampoo.  4. Use CHG as you would any other liquid soap. You can apply CHG directly to the skin and wash gently with a scrungie or a clean washcloth.   5. Apply the CHG Soap to your body ONLY FROM THE NECK DOWN.  Do not use on open wounds or open sores. Avoid contact with your eyes, ears, mouth and genitals (private parts). Wash Face and genitals (private parts)  with your normal soap.  6. Wash thoroughly, paying special attention to the area where your surgery will be performed.  7. Thoroughly rinse your body with warm water from the neck down.  8. DO NOT shower/wash with your normal soap after using and rinsing off the CHG Soap.  9. Pat yourself dry with a CLEAN TOWEL.  10. Wear CLEAN PAJAMAS to bed the night before surgery, wear comfortable clothes the morning of surgery  11. Place CLEAN SHEETS on your bed the night of your first shower and DO NOT SLEEP WITH PETS.    Day of Surgery: Do not apply any deodorants/lotions. Please wear clean clothes to the  hospital/surgery center.      Please read over the following fact sheets that you were given.

## 2017-04-24 ENCOUNTER — Encounter (HOSPITAL_COMMUNITY)
Admission: RE | Admit: 2017-04-24 | Discharge: 2017-04-24 | Disposition: A | Discharge status: Home / Self Care | Payer: Medicare Other | Source: Ambulatory Visit | Attending: Dentistry | Admitting: Dentistry

## 2017-04-24 ENCOUNTER — Encounter (HOSPITAL_COMMUNITY): Payer: Self-pay

## 2017-04-24 DIAGNOSIS — I959 Hypotension, unspecified: Secondary | ICD-10-CM | POA: Diagnosis not present

## 2017-04-24 DIAGNOSIS — E039 Hypothyroidism, unspecified: Secondary | ICD-10-CM | POA: Insufficient documentation

## 2017-04-24 DIAGNOSIS — R000 Tachycardia, unspecified: Secondary | ICD-10-CM | POA: Insufficient documentation

## 2017-04-24 DIAGNOSIS — Z01812 Encounter for preprocedural laboratory examination: Secondary | ICD-10-CM | POA: Insufficient documentation

## 2017-04-24 DIAGNOSIS — K219 Gastro-esophageal reflux disease without esophagitis: Secondary | ICD-10-CM | POA: Insufficient documentation

## 2017-04-24 DIAGNOSIS — F840 Autistic disorder: Secondary | ICD-10-CM | POA: Diagnosis not present

## 2017-04-24 DIAGNOSIS — Z01810 Encounter for preprocedural cardiovascular examination: Secondary | ICD-10-CM | POA: Diagnosis present

## 2017-04-24 HISTORY — DX: Hypothyroidism, unspecified: E03.9

## 2017-04-24 HISTORY — DX: Hypotension, unspecified: I95.9

## 2017-04-24 HISTORY — DX: Autistic disorder: F84.0

## 2017-04-24 LAB — BASIC METABOLIC PANEL
Anion gap: 8 (ref 5–15)
BUN: 15 mg/dL (ref 6–20)
CO2: 25 mmol/L (ref 22–32)
Calcium: 9.1 mg/dL (ref 8.9–10.3)
Chloride: 106 mmol/L (ref 101–111)
Creatinine, Ser: 1.03 mg/dL (ref 0.61–1.24)
Glucose, Bld: 98 mg/dL (ref 65–99)
POTASSIUM: 4 mmol/L (ref 3.5–5.1)
SODIUM: 139 mmol/L (ref 135–145)

## 2017-04-24 LAB — CBC
HCT: 42.4 % (ref 39.0–52.0)
Hemoglobin: 14.6 g/dL (ref 13.0–17.0)
MCH: 32.5 pg (ref 26.0–34.0)
MCHC: 34.4 g/dL (ref 30.0–36.0)
MCV: 94.4 fL (ref 78.0–100.0)
Platelets: 128 10*3/uL — ABNORMAL LOW (ref 150–400)
RBC: 4.49 MIL/uL (ref 4.22–5.81)
RDW: 12.7 % (ref 11.5–15.5)
WBC: 4.1 10*3/uL (ref 4.0–10.5)

## 2017-04-25 NOTE — Progress Notes (Addendum)
Anesthesia Chart Review:  Pt is a 46 year old male scheduled for dental restoration/extraction with x-ray on 04/26/2017 with Justin Johnson, DDS.   - [facility]   - H&P form on paper chart.   PMH includes:  Hypotension, hypothyroidism, autism, GERD. Never smoker. BMI 28  Anesthesia history: per mother, conscious sedation is inadequate to do procedures with pt.  Mother reports after receiving max IV conscious sedation meds for EGD about 10 years ago, pt was still fully awake and not cooperative.   Medications include: abilify, levothyroxine, nifedipine, prilosec, zantac, triazolam.   BP 131/60   Pulse (!) 106 Comment: taken manually/ notified RN  Temp 36.7 C (Axillary)   Resp 20   Ht 5' (1.524 m)   Wt 145 lb 1.6 oz (65.8 kg)   SpO2 100%   BMI 28.34 kg/m   Preoperative labs reviewed.    EKG 04/24/17: sinus tachycardia (112 bpm)  Prior records in Epic/care everywhere show pt frequently slightly tachycardic. I spoke with pt's mother, Justin Johnson, by telephone.  Pt does not have cardiac/tachycardia issues but is very afraid/upset in hospital settings.  She believes tachycardia is likely due to anxiety/agitation about being in hospital.   If no changes, I anticipate pt can proceed with surgery as scheduled.   Justin Mastngela Lantz Hermann, FNP-BC [facility]/Anesthesiology Phone: [phone] 04/25/2017 4:31 PM

## 2017-04-26 ENCOUNTER — Ambulatory Visit (HOSPITAL_COMMUNITY): Payer: Medicare Other | Admitting: Emergency Medicine

## 2017-04-26 ENCOUNTER — Encounter (HOSPITAL_COMMUNITY): Payer: Self-pay | Admitting: *Deleted

## 2017-04-26 ENCOUNTER — Ambulatory Visit (HOSPITAL_COMMUNITY)
Admission: RE | Admit: 2017-04-26 | Discharge: 2017-04-26 | Disposition: A | Discharge status: Home / Self Care | Payer: Medicare Other | Source: Ambulatory Visit | Attending: Dentistry | Admitting: Dentistry

## 2017-04-26 ENCOUNTER — Ambulatory Visit (HOSPITAL_COMMUNITY): Payer: Medicare Other | Admitting: Certified Registered Nurse Anesthetist

## 2017-04-26 ENCOUNTER — Encounter (HOSPITAL_COMMUNITY)
Admission: RE | Disposition: A | Discharge status: Home / Self Care | Payer: Self-pay | Source: Ambulatory Visit | Attending: Dentistry

## 2017-04-26 DIAGNOSIS — R6250 Unspecified lack of expected normal physiological development in childhood: Secondary | ICD-10-CM | POA: Diagnosis not present

## 2017-04-26 DIAGNOSIS — F919 Conduct disorder, unspecified: Secondary | ICD-10-CM | POA: Diagnosis not present

## 2017-04-26 DIAGNOSIS — F79 Unspecified intellectual disabilities: Secondary | ICD-10-CM | POA: Insufficient documentation

## 2017-04-26 DIAGNOSIS — K029 Dental caries, unspecified: Secondary | ICD-10-CM | POA: Diagnosis present

## 2017-04-26 HISTORY — PX: DENTAL RESTORATION/EXTRACTION WITH X-RAY: SHX5796

## 2017-04-26 SURGERY — DENTAL RESTORATION/EXTRACTION WITH X-RAY
Anesthesia: General | Site: Mouth | Laterality: Bilateral

## 2017-04-26 MED ORDER — DEXAMETHASONE SODIUM PHOSPHATE 10 MG/ML IJ SOLN
INTRAMUSCULAR | Status: AC
  Filled 2017-04-26: qty 1

## 2017-04-26 MED ORDER — ONDANSETRON HCL 4 MG/2ML IJ SOLN
INTRAMUSCULAR | Status: AC
  Filled 2017-04-26: qty 2

## 2017-04-26 MED ORDER — DEXAMETHASONE SODIUM PHOSPHATE 4 MG/ML IJ SOLN
INTRAMUSCULAR | Status: DC | PRN
  Administered 2017-04-26: 8 mg via INTRAVENOUS

## 2017-04-26 MED ORDER — FENTANYL CITRATE (PF) 250 MCG/5ML IJ SOLN
INTRAMUSCULAR | Status: AC
  Filled 2017-04-26: qty 5

## 2017-04-26 MED ORDER — SUGAMMADEX SODIUM 200 MG/2ML IV SOLN
INTRAVENOUS | Status: DC | PRN
  Administered 2017-04-26: 150 mg via INTRAVENOUS

## 2017-04-26 MED ORDER — FENTANYL CITRATE (PF) 100 MCG/2ML IJ SOLN
INTRAMUSCULAR | Status: DC | PRN
  Administered 2017-04-26: 50 ug via INTRAVENOUS

## 2017-04-26 MED ORDER — OXYMETAZOLINE HCL 0.05 % NA SOLN
NASAL | Status: AC
  Filled 2017-04-26: qty 15

## 2017-04-26 MED ORDER — ROCURONIUM BROMIDE 10 MG/ML (PF) SYRINGE
PREFILLED_SYRINGE | INTRAVENOUS | Status: AC
  Filled 2017-04-26: qty 10

## 2017-04-26 MED ORDER — LACTATED RINGERS IV SOLN
Freq: Once | INTRAVENOUS | Status: AC
Start: 2017-04-26 — End: 2017-04-26
  Administered 2017-04-26: 13:00:00 via INTRAVENOUS

## 2017-04-26 MED ORDER — ONDANSETRON HCL 4 MG/2ML IJ SOLN
INTRAMUSCULAR | Status: DC | PRN
  Administered 2017-04-26: 4 mg via INTRAVENOUS

## 2017-04-26 MED ORDER — CHLORHEXIDINE GLUCONATE 0.12 % MT SOLN
OROMUCOSAL | Status: DC | PRN
  Administered 2017-04-26: 15 mL via OROMUCOSAL

## 2017-04-26 MED ORDER — LIDOCAINE HCL (CARDIAC) 20 MG/ML IV SOLN
INTRAVENOUS | Status: DC | PRN
  Administered 2017-04-26: 40 mg via INTRAVENOUS

## 2017-04-26 MED ORDER — OXYMETAZOLINE HCL 0.05 % NA SOLN
NASAL | Status: DC | PRN
  Administered 2017-04-26: 2 via NASAL

## 2017-04-26 MED ORDER — PROPOFOL 10 MG/ML IV BOLUS
INTRAVENOUS | Status: DC | PRN
  Administered 2017-04-26: 170 mg via INTRAVENOUS

## 2017-04-26 MED ORDER — PROPOFOL 10 MG/ML IV BOLUS
INTRAVENOUS | Status: AC
  Filled 2017-04-26: qty 20

## 2017-04-26 MED ORDER — LIDOCAINE-EPINEPHRINE 2 %-1:100000 IJ SOLN
INTRAMUSCULAR | Status: AC
  Filled 2017-04-26: qty 1

## 2017-04-26 MED ORDER — LIDOCAINE 2% (20 MG/ML) 5 ML SYRINGE
INTRAMUSCULAR | Status: AC
  Filled 2017-04-26: qty 5

## 2017-04-26 MED ORDER — MIDAZOLAM HCL 5 MG/5ML IJ SOLN
INTRAMUSCULAR | Status: DC | PRN
  Administered 2017-04-26: 2 mg via INTRAVENOUS

## 2017-04-26 MED ORDER — ROCURONIUM BROMIDE 100 MG/10ML IV SOLN
INTRAVENOUS | Status: DC | PRN
  Administered 2017-04-26: 50 mg via INTRAVENOUS

## 2017-04-26 MED ORDER — EPHEDRINE 5 MG/ML INJ
INTRAVENOUS | Status: AC
  Filled 2017-04-26: qty 10

## 2017-04-26 MED ORDER — KETOROLAC TROMETHAMINE 30 MG/ML IJ SOLN
INTRAMUSCULAR | Status: AC
  Filled 2017-04-26: qty 1

## 2017-04-26 MED ORDER — MIDAZOLAM HCL 2 MG/2ML IJ SOLN
INTRAMUSCULAR | Status: AC
  Filled 2017-04-26: qty 2

## 2017-04-26 MED ORDER — LIDOCAINE-EPINEPHRINE 2 %-1:100000 IJ SOLN
INTRAMUSCULAR | Status: DC | PRN
  Administered 2017-04-26: 3 mL

## 2017-04-26 MED ORDER — SUGAMMADEX SODIUM 200 MG/2ML IV SOLN
INTRAVENOUS | Status: AC
  Filled 2017-04-26: qty 2

## 2017-04-26 MED ORDER — HEMOSTATIC AGENTS (NO CHARGE) OPTIME
TOPICAL | Status: DC | PRN
  Administered 2017-04-26: 1 via TOPICAL

## 2017-04-26 MED ORDER — PHENYLEPHRINE 40 MCG/ML (10ML) SYRINGE FOR IV PUSH (FOR BLOOD PRESSURE SUPPORT)
PREFILLED_SYRINGE | INTRAVENOUS | Status: AC
  Filled 2017-04-26: qty 20

## 2017-04-26 SURGICAL SUPPLY — 22 items
CANISTER SUCT 3000ML PPV (MISCELLANEOUS) ×3 IMPLANT
COVER BACK TABLE 60X90IN (DRAPES) ×3 IMPLANT
COVER SURGICAL LIGHT HANDLE (MISCELLANEOUS) ×3 IMPLANT
DRAPE HALF SHEET 40X57 (DRAPES) ×3 IMPLANT
GAUZE SPONGE 4X4 16PLY XRAY LF (GAUZE/BANDAGES/DRESSINGS) ×3 IMPLANT
GLOVE BIO SURGEON STRL SZ7.5 (GLOVE) ×3 IMPLANT
GOWN STRL REUS W/ TWL LRG LVL3 (GOWN DISPOSABLE) ×2 IMPLANT
GOWN STRL REUS W/TWL LRG LVL3 (GOWN DISPOSABLE) ×4
KIT BASIN OR (CUSTOM PROCEDURE TRAY) ×3 IMPLANT
KIT ROOM TURNOVER OR (KITS) ×3 IMPLANT
NEEDLE 25GX 5/8IN NON SAFETY (NEEDLE) ×3 IMPLANT
NEEDLE PRECISIONGLIDE 27X1.5 (NEEDLE) IMPLANT
PAD ARMBOARD 7.5X6 YLW CONV (MISCELLANEOUS) ×6 IMPLANT
SPONGE SURGIFOAM ABS GEL 12-7 (HEMOSTASIS) ×3 IMPLANT
SUT CHROMIC 3 0 PS 2 (SUTURE) ×3 IMPLANT
SYR CONTROL 10ML LL (SYRINGE) ×3 IMPLANT
TOOTHBRUSH ADULT (PERSONAL CARE ITEMS) IMPLANT
TOWEL OR 17X26 10 PK STRL BLUE (TOWEL DISPOSABLE) ×3 IMPLANT
TUBE CONNECTING 12'X1/4 (SUCTIONS) ×1
TUBE CONNECTING 12X1/4 (SUCTIONS) ×2 IMPLANT
WATER TABLETS ICX (MISCELLANEOUS) ×3 IMPLANT
YANKAUER SUCT BULB TIP NO VENT (SUCTIONS) ×3 IMPLANT

## 2017-04-26 NOTE — Transfer of Care (Signed)
Immediate Anesthesia Transfer of Care Note  Patient: Justin Johnson M Menon  Procedure(s) Performed: RECALL EXAM, FULL MOUTH XRAYS, FULL MOUTH DEBRIDMENT, NUMBER TWELVE EXTRACTION, GLASS IONOMERS NUMBER SIX (F) NUMBER THREE (M) NUMBER TWENTY-THREE (F) NUMBER TWO (O) (Bilateral Mouth)  Patient Location: PACU  Anesthesia Type:General  Level of Consciousness: awake, alert  and patient cooperative  Airway & Oxygen Therapy: Patient Spontanous Breathing  Post-op Assessment: Report given to RN and Post -op Vital signs reviewed and stable  Post vital signs: Reviewed and stable  Last Vitals:  Vitals:   04/26/17 1001 04/26/17 1440  BP:  117/67  Pulse:  61  Resp:  (!) 8  Temp: 36.8 C (!) 36.3 C  SpO2:  100%    Last Pain:  Vitals:   04/26/17 1440  TempSrc:   PainSc: 0-No pain      Patients Stated Pain Goal: 3 (04/26/17 1017)  Complications: No apparent anesthesia complications

## 2017-04-26 NOTE — Anesthesia Preprocedure Evaluation (Signed)
Anesthesia Evaluation  Patient identified by MRN, date of birth, ID band Patient awake    Reviewed: Allergy & Precautions, NPO status , Patient's Chart, lab work & pertinent test results  Airway Mallampati: II  TM Distance: >3 FB     Dental  (+) Teeth Intact   Pulmonary    breath sounds clear to auscultation       Cardiovascular  Rhythm:Regular Rate:Normal     Neuro/Psych    GI/Hepatic   Endo/Other    Renal/GU      Musculoskeletal   Abdominal   Peds  Hematology   Anesthesia Other Findings   Reproductive/Obstetrics                             Anesthesia Physical Anesthesia Plan  ASA: III  Anesthesia Plan: General   Post-op Pain Management:    Induction: Intravenous  PONV Risk Score and Plan: Ondansetron and Dexamethasone  Airway Management Planned: Nasal ETT  Additional Equipment:   Intra-op Plan:   Post-operative Plan: Extubation in OR  Informed Consent: I have reviewed the patients History and Physical, chart, labs and discussed the procedure including the risks, benefits and alternatives for the proposed anesthesia with the patient or authorized representative who has indicated his/her understanding and acceptance.     Plan Discussed with: CRNA and Anesthesiologist  Anesthesia Plan Comments:         Anesthesia Quick Evaluation

## 2017-04-26 NOTE — Anesthesia Procedure Notes (Addendum)
Procedure Name: Intubation Date/Time: 04/26/2017 1:27 PM Performed by: Salli Quarry Vinisha Faxon Pre-anesthesia Checklist: Patient identified, Suction available, Emergency Drugs available and Patient being monitored Patient Re-evaluated:Patient Re-evaluated prior to induction Oxygen Delivery Method: Circle system utilized Preoxygenation: Pre-oxygenation with 100% oxygen Induction Type: IV induction Ventilation: Mask ventilation without difficulty Laryngoscope Size: Mac and 4 Grade View: Grade I Nasal Tubes: Left, Nasal Rae, Magill forceps- large, utilized and Nasal prep performed Tube size: 7.0 mm Number of attempts: 1 Secured at: 26 cm Tube secured with: Tape Dental Injury: Teeth and Oropharynx as per pre-operative assessment

## 2017-04-26 NOTE — Anesthesia Procedure Notes (Deleted)
Performed by: WHITE, Jerrelle Michelsen TENA Laelani Vasko       

## 2017-04-26 NOTE — Anesthesia Postprocedure Evaluation (Signed)
Anesthesia Post Note  Patient: Justin Johnson  Procedure(s) Performed: RECALL EXAM, FULL MOUTH XRAYS, FULL MOUTH DEBRIDMENT, NUMBER TWELVE EXTRACTION, GLASS IONOMERS NUMBER SIX (F) NUMBER THREE (M) NUMBER TWENTY-THREE (F) NUMBER TWO (O) (Bilateral Mouth)     Patient location during evaluation: PACU Anesthesia Type: General Level of consciousness: awake, awake and alert and oriented Pain management: pain level controlled Vital Signs Assessment: post-procedure vital signs reviewed and stable Respiratory status: spontaneous breathing, nonlabored ventilation and respiratory function stable Cardiovascular status: blood pressure returned to baseline Anesthetic complications: no    Last Vitals:  Vitals:   04/26/17 1500 04/26/17 1505  BP:  121/78  Pulse: 69 78  Resp: (!) 26 20  Temp:  (!) 36.2 C  SpO2: 100% 100%    Last Pain:  Vitals:   04/26/17 1505  TempSrc:   PainSc: 0-No pain                 Majesty Stehlin COKER

## 2017-04-26 NOTE — H&P (Signed)
H&P reviewed, no changes in assessment  

## 2017-04-26 NOTE — Op Note (Signed)
Recall exam, Full Mouth X-ray, Full Mouth Debridement Extraction - #12 with 3 cc 2% Lidocaine 1:100,000 epi with gel foam and 3-0 chromic suture closure. Glass Ionomers - #2 (O), 3 (M), 6 (F), 23 (F) 10 cc blood loss

## 2017-04-26 NOTE — Brief Op Note (Signed)
04/26/2017  2:42 PM  PATIENT:  Justin Johnson  46 y.o. male  PRE-OPERATIVE DIAGNOSIS:  DENTAL CARIES  POST-OPERATIVE DIAGNOSIS:  DENTAL CARIES  PROCEDURE:  Procedure(s): RECALL EXAM, FULL MOUTH XRAYS, FULL MOUTH DEBRIDMENT, NUMBER TWELVE EXTRACTION, GLASS IONOMERS NUMBER SIX (F) NUMBER THREE (M) NUMBER TWENTY-THREE (F) NUMBER TWO (O) (Bilateral)  SURGEON:  Surgeon(s) and Role:    * Boneta LucksMilner, Vylette Strubel, DDS - Primary  PHYSICIAN ASSISTANT:   ASSISTANTS: Laqueta CarinaMary Elizabeth Andreyev   ANESTHESIA:   general  EBL:  10 mL   BLOOD ADMINISTERED:none  DRAINS: none   LOCAL MEDICATIONS USED:  LIDOCAINE   SPECIMEN:  No Specimen  DISPOSITION OF SPECIMEN:  N/A  COUNTS:  YES  TOURNIQUET:  * No tourniquets in log *  DICTATION: .Note written in EPIC  PLAN OF CARE: Discharge to home after PACU  PATIENT DISPOSITION:  PACU - hemodynamically stable.   Delay start of Pharmacological VTE agent (>24hrs) due to surgical blood loss or risk of bleeding: not applicable

## 2017-04-27 ENCOUNTER — Encounter (HOSPITAL_COMMUNITY): Payer: Self-pay | Admitting: Dentistry

## 2017-04-27 NOTE — Op Note (Signed)
NAMFerdinand Lango:  Justin Johnson, Justin Johnson                ACCOUNT NO.:  [id number]  MEDICAL RECORD NO.:  [id number]  LOCATION:                                 FACILITY:  PHYSICIAN:  Esaw DaceWilliam E. 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  Dental care provided in the OR for medically necessary treatment.  OPERATION:  Full-mouth oral rehabilitation including exam, x-rays, cleaning, extractions.  OPERATIVE SURGEON:  Azucena FreedWilliam E. Collin Hendley, D.D.S.  ASSISTANT:  Laqueta CarinaMary Elizabeth Andreyev and hospital staff.  ANESTHESIA:  General.  DESCRIPTION OF PROCEDURE:  The patient was brought into the operating room and placed in the supine position.  General anesthesia was administered via nasal intubation.  The patient was prepped and draped in the usual manner for an intraoral general dentistry procedure. Oropharynx was suctioned and a moistened posterior throat pack was placed.  A full intraoral exam including all hard and soft tissues was performed.  This was a re-call exam.  Soft tissue exam reveals the floor of the mouth, buccal mucosa, soft palate, hard palate, tongue, gingival and frenum attachments all within normal limits with regard to size, color, and consistency.  Hard tissue exam reveals tooth #1 missing, tooth #2 and #3 present, #4 and #5 missing, #6 through #12 present, #13 through #17 missing, #18 present, #19 missing, #20 through #28 present, #29 through #32 missing.  A full-mouth series of digital x-rays was taken and full-mouth debridement was completed.  Operative care was provided with a high-speed handpiece #557 and a #4 bur and a slow speed handpiece #4 bur.  Glass ionomers were completed on #2 (O)coronal/chewing, #3 (M)coronal/smooth, #6 (F)coronal/smooth, #23 (F)coronal/smooth.  Extraction was done  on #12.  Gelfoam was inserted into the extraction site and 3-0 chromic suture was used to close the site.  3 mL of 2% lidocaine with 1:100,000 epinephrine was given.  Estimated blood loss of 10 mL.  The mouth was suctioned dry and a posterior throat pack was carefully removed with constant suction.  The patient was awakened in the OR and transferred to the recovery room in good condition.  An extraction sheet was signed by the mother.    Esaw DaceWilliam E. Merla Sawka Jr., D.D.S.   ______________________________ Esaw DaceWilliam E. Abeeha Twist Jr., D.D.S.   WEM/MEDQ  D:  04/26/2017  T:  04/27/2017  Job:  [id number]

## 2019-06-03 ENCOUNTER — Ambulatory Visit: Payer: Self-pay | Admitting: Dentistry

## 2019-06-05 ENCOUNTER — Other Ambulatory Visit: Payer: Self-pay

## 2019-06-05 ENCOUNTER — Encounter (HOSPITAL_COMMUNITY): Payer: Self-pay | Admitting: *Deleted

## 2019-06-05 NOTE — Progress Notes (Signed)
Patient lives in a group home. Spoke with  Nurse Vivia Ewing Teal @[phone]. Nurse states patient does not have any shortness of breath, fever, cough or chest pain.  PCP - Dr Koren Shiver Cardiologist - denies  Chest x-ray -denies  EKG - denies Stress Test - denies ECHO - denies Cardiac Cath - denies  Anesthesia review: Yes  STOP now taking any Aspirin (unless otherwise instructed by your surgeon), Aleve, Naproxen, Ibuprofen, Motrin, Advil, Goody's, BC's, all herbal medications, fish oil, and all vitamins.   Coronavirus Screening Covid test on DOS 06/06/19.  Justin Johnson will get approval for rapid test.    Nurse Anderson Malta verbalized understanding of instructions that were given via phone.

## 2019-06-06 ENCOUNTER — Ambulatory Visit (HOSPITAL_COMMUNITY): Payer: Medicare Other | Admitting: Physician Assistant

## 2019-06-06 ENCOUNTER — Encounter (HOSPITAL_COMMUNITY)
Admission: RE | Disposition: A | Discharge status: Home / Self Care | Payer: Self-pay | Source: Home / Self Care | Attending: Dentistry

## 2019-06-06 ENCOUNTER — Encounter (HOSPITAL_COMMUNITY): Payer: Self-pay

## 2019-06-06 ENCOUNTER — Ambulatory Visit (HOSPITAL_COMMUNITY)
Admission: RE | Admit: 2019-06-06 | Discharge: 2019-06-06 | Disposition: A | Discharge status: Home / Self Care | Payer: Medicare Other | Attending: Dentistry | Admitting: Dentistry

## 2019-06-06 DIAGNOSIS — K219 Gastro-esophageal reflux disease without esophagitis: Secondary | ICD-10-CM | POA: Insufficient documentation

## 2019-06-06 DIAGNOSIS — F840 Autistic disorder: Secondary | ICD-10-CM | POA: Insufficient documentation

## 2019-06-06 DIAGNOSIS — Z20828 Contact with and (suspected) exposure to other viral communicable diseases: Secondary | ICD-10-CM | POA: Insufficient documentation

## 2019-06-06 DIAGNOSIS — F73 Profound intellectual disabilities: Secondary | ICD-10-CM | POA: Diagnosis not present

## 2019-06-06 DIAGNOSIS — I10 Essential (primary) hypertension: Secondary | ICD-10-CM | POA: Insufficient documentation

## 2019-06-06 DIAGNOSIS — K029 Dental caries, unspecified: Secondary | ICD-10-CM | POA: Diagnosis present

## 2019-06-06 DIAGNOSIS — E039 Hypothyroidism, unspecified: Secondary | ICD-10-CM | POA: Insufficient documentation

## 2019-06-06 HISTORY — DX: Profound intellectual disabilities: F73

## 2019-06-06 HISTORY — PX: DENTAL RESTORATION/EXTRACTION WITH X-RAY: SHX5796

## 2019-06-06 HISTORY — DX: Essential (primary) hypertension: I10

## 2019-06-06 HISTORY — DX: Unspecified cataract: H26.9

## 2019-06-06 LAB — RESPIRATORY PANEL BY RT PCR (FLU A&B, COVID)
Influenza A by PCR: NEGATIVE
Influenza B by PCR: NEGATIVE
SARS Coronavirus 2 by RT PCR: NEGATIVE

## 2019-06-06 SURGERY — DENTAL RESTORATION/EXTRACTION WITH X-RAY
Anesthesia: General | Site: Mouth

## 2019-06-06 MED ORDER — OXYMETAZOLINE HCL 0.05 % NA SOLN
NASAL | Status: AC
  Filled 2019-06-06: qty 30

## 2019-06-06 MED ORDER — FENTANYL CITRATE (PF) 250 MCG/5ML IJ SOLN
INTRAMUSCULAR | Status: AC
  Filled 2019-06-06: qty 5

## 2019-06-06 MED ORDER — LIDOCAINE-EPINEPHRINE 2 %-1:100000 IJ SOLN
INTRAMUSCULAR | Status: AC
  Filled 2019-06-06: qty 3.4

## 2019-06-06 MED ORDER — DEXMEDETOMIDINE HCL 200 MCG/2ML IV SOLN
INTRAVENOUS | Status: DC | PRN
  Administered 2019-06-06 (×4): 4 ug via INTRAVENOUS

## 2019-06-06 MED ORDER — ONDANSETRON HCL 4 MG/2ML IJ SOLN
INTRAMUSCULAR | Status: DC | PRN
  Administered 2019-06-06: 4 mg via INTRAVENOUS

## 2019-06-06 MED ORDER — KETOROLAC TROMETHAMINE 30 MG/ML IJ SOLN
INTRAMUSCULAR | Status: DC | PRN
  Administered 2019-06-06: 30 mg via INTRAVENOUS

## 2019-06-06 MED ORDER — EPHEDRINE SULFATE-NACL 50-0.9 MG/10ML-% IV SOSY
PREFILLED_SYRINGE | INTRAVENOUS | Status: DC | PRN
  Administered 2019-06-06: 15 mg via INTRAVENOUS

## 2019-06-06 MED ORDER — DEXAMETHASONE SODIUM PHOSPHATE 10 MG/ML IJ SOLN
INTRAMUSCULAR | Status: AC
  Filled 2019-06-06: qty 1

## 2019-06-06 MED ORDER — LIDOCAINE-EPINEPHRINE 2 %-1:100000 IJ SOLN
INTRAMUSCULAR | Status: DC | PRN
  Administered 2019-06-06: 3.4 mL

## 2019-06-06 MED ORDER — DEXAMETHASONE SODIUM PHOSPHATE 10 MG/ML IJ SOLN
INTRAMUSCULAR | Status: DC | PRN
  Administered 2019-06-06: 10 mg via INTRAVENOUS

## 2019-06-06 MED ORDER — ROCURONIUM BROMIDE 10 MG/ML (PF) SYRINGE
PREFILLED_SYRINGE | INTRAVENOUS | Status: AC
  Filled 2019-06-06: qty 10

## 2019-06-06 MED ORDER — PROPOFOL 10 MG/ML IV BOLUS
INTRAVENOUS | Status: AC
  Filled 2019-06-06: qty 20

## 2019-06-06 MED ORDER — PROPOFOL 10 MG/ML IV BOLUS
INTRAVENOUS | Status: DC | PRN
  Administered 2019-06-06: 120 mg via INTRAVENOUS

## 2019-06-06 MED ORDER — LIDOCAINE 2% (20 MG/ML) 5 ML SYRINGE
INTRAMUSCULAR | Status: AC
  Filled 2019-06-06: qty 5

## 2019-06-06 MED ORDER — LACTATED RINGERS IV SOLN
INTRAVENOUS | Status: DC
  Administered 2019-06-06: 11:00:00 via INTRAVENOUS

## 2019-06-06 MED ORDER — OXYMETAZOLINE HCL 0.05 % NA SOLN
NASAL | Status: DC | PRN
  Administered 2019-06-06: 1

## 2019-06-06 MED ORDER — FENTANYL CITRATE (PF) 100 MCG/2ML IJ SOLN
INTRAMUSCULAR | Status: DC | PRN
  Administered 2019-06-06: 50 ug via INTRAVENOUS
  Administered 2019-06-06: 100 ug via INTRAVENOUS

## 2019-06-06 MED ORDER — SUCCINYLCHOLINE CHLORIDE 200 MG/10ML IV SOSY
PREFILLED_SYRINGE | INTRAVENOUS | Status: DC | PRN
  Administered 2019-06-06: 140 mg via INTRAVENOUS

## 2019-06-06 MED ORDER — MIDAZOLAM HCL 2 MG/2ML IJ SOLN
INTRAMUSCULAR | Status: AC
  Filled 2019-06-06: qty 2

## 2019-06-06 MED ORDER — OXYMETAZOLINE HCL 0.05 % NA SOLN
NASAL | Status: DC | PRN
  Administered 2019-06-06: 2 via NASAL

## 2019-06-06 MED ORDER — ONDANSETRON HCL 4 MG/2ML IJ SOLN
INTRAMUSCULAR | Status: AC
  Filled 2019-06-06: qty 2

## 2019-06-06 MED ORDER — MIDAZOLAM HCL 5 MG/5ML IJ SOLN
INTRAMUSCULAR | Status: DC | PRN
  Administered 2019-06-06: 2 mg via INTRAVENOUS

## 2019-06-06 SURGICAL SUPPLY — 27 items
BLADE SURG 15 STRL LF DISP TIS (BLADE) ×1 IMPLANT
BLADE SURG 15 STRL SS (BLADE) ×2
BUR ROUND PRECISION 4.0 (BURR) IMPLANT
BUR ROUND PRECISION 4.0MM (BURR)
CANISTER SUCT 3000ML PPV (MISCELLANEOUS) ×3 IMPLANT
COVER BACK TABLE 60X90IN (DRAPES) ×3 IMPLANT
COVER MAYO STAND STRL (DRAPES) ×3 IMPLANT
COVER PROBE W GEL 5X96 (DRAPES) IMPLANT
COVER SURGICAL LIGHT HANDLE (MISCELLANEOUS) ×3 IMPLANT
DRAPE HALF SHEET 40X57 (DRAPES) ×3 IMPLANT
GAUZE SPONGE 4X4 16PLY XRAY LF (GAUZE/BANDAGES/DRESSINGS) ×3 IMPLANT
GLOVE BIO SURGEON STRL SZ 6.5 (GLOVE) ×2 IMPLANT
GLOVE BIO SURGEONS STRL SZ 6.5 (GLOVE) ×1
GOWN STRL REUS W/ TWL LRG LVL3 (GOWN DISPOSABLE) ×2 IMPLANT
GOWN STRL REUS W/TWL LRG LVL3 (GOWN DISPOSABLE) ×4
KIT BASIN OR (CUSTOM PROCEDURE TRAY) ×3 IMPLANT
KIT TURNOVER KIT B (KITS) ×3 IMPLANT
NEEDLE DENTAL 27 LONG (NEEDLE) ×3 IMPLANT
PAD ARMBOARD 7.5X6 YLW CONV (MISCELLANEOUS) ×6 IMPLANT
SPONGE SURGIFOAM ABS GEL SZ50 (HEMOSTASIS) ×3 IMPLANT
SUT CHROMIC 3 0 PS 2 (SUTURE) ×3 IMPLANT
TOOTHBRUSH ADULT (PERSONAL CARE ITEMS) ×3 IMPLANT
TOWEL GREEN STERILE (TOWEL DISPOSABLE) ×3 IMPLANT
TUBE CONNECTING 12'X1/4 (SUCTIONS) ×1
TUBE CONNECTING 12X1/4 (SUCTIONS) ×2 IMPLANT
WATER TABLETS ICX (MISCELLANEOUS) ×3 IMPLANT
YANKAUER SUCT BULB TIP NO VENT (SUCTIONS) ×3 IMPLANT

## 2019-06-06 NOTE — Transfer of Care (Signed)
Immediate Anesthesia Transfer of Care Note  Patient: Justin Johnson  Procedure(s) Performed: Recall Exam, Full Mouth X-Ray with Caries decay and fractured Teeth noted. Fractured teeth # 2, 24  Full Mouth debriedment Extractions 3, 24 2 Carpules of 1:100000 2% Lidocaine with Epinephrine. 3-0 Chromic Gut Suture with Gel Foam in extraction site. Amalgum # 20 MOD, 21 MO Glass Ionomer # 22 MF, 23 MFD, 28 F, 6 MF, 7 DF (N/A Mouth)  Patient Location: PACU  Anesthesia Type:General  Level of Consciousness: drowsy and patient cooperative  Airway & Oxygen Therapy: Patient Spontanous Breathing  Post-op Assessment: Report given to RN and Post -op Vital signs reviewed and stable  Post vital signs: Reviewed and stable  Last Vitals:  Vitals Value Taken Time  BP 129/77 06/06/19 1234  Temp 36.7 C 06/06/19 1235  Pulse 107 06/06/19 1235  Resp 16 06/06/19 1235  SpO2 98 % 06/06/19 1235  Vitals shown include unvalidated device data.  Last Pain:  Vitals:   06/06/19 0756  PainSc: 0-No pain      Patients Stated Pain Goal: 3 (54/98/26 4158)  Complications: No apparent anesthesia complications

## 2019-06-06 NOTE — Brief Op Note (Signed)
06/06/2019  12:54 PM  PATIENT:  Clifton James  48 y.o. male  PRE-OPERATIVE DIAGNOSIS:  DENTAL CARIES  POST-OPERATIVE DIAGNOSIS:  DENTAL CARIES  PROCEDURE:  Procedure(s) with comments: Recall Exam, Full Mouth X-Ray with Caries decay and fractured Teeth noted. Fractured teeth # 2, 24  Full Mouth debriedment Extractions 3, 24 2 Carpules of 1:100000 2% Lidocaine with Epinephrine. 3-0 Chromic Gut Suture with Gel Foam in extraction site. Amalgum # 20 MOD, 21 MO Glass Ionomer # 22 MF, 23 MFD, 28 F, 6 MF, 7 DF (N/A) - Recall Exam, Full Mouth X-Ray with Caries decay and fractured Teeth noted. Fractured teeth # 2, 24  Full Mouth debriedment Extractions 3, 24 2 Carpules of 1:100000 2% Lidocaine with Epinephrine. 3-0 Chromic Gut Suture with Gel Foam in extraction site. Amalgum # 20 MOD, 21 MO Glass Ionomer # 22 MF, 23 MFD, 28 F, 6 MF, 7 DF  SURGEON:  Surgeon(s) and Role:    Verdene Lennert, DDS - Primary  PHYSICIAN ASSISTANT:   ASSISTANTS: Cheyenne Adas  ANESTHESIA:   general  EBL:  25 mL   BLOOD ADMINISTERED:none  DRAINS: none   LOCAL MEDICATIONS USED:  LIDOCAINE   SPECIMEN:  No Specimen  DISPOSITION OF SPECIMEN:  N/A  COUNTS:  YES  TOURNIQUET:  * No tourniquets in log *  DICTATION: .Note written in EPIC  PLAN OF CARE: Discharge to home after PACU  PATIENT DISPOSITION:  PACU - hemodynamically stable.   Delay start of Pharmacological VTE agent (>24hrs) due to surgical blood loss or risk of bleeding: not applicable

## 2019-06-06 NOTE — Anesthesia Procedure Notes (Addendum)
Procedure Name: Intubation Date/Time: 06/06/2019 10:42 AM Performed by: Lance Coon, CRNA Pre-anesthesia Checklist: Patient identified, Emergency Drugs available, Suction available, Patient being monitored and Timeout performed Patient Re-evaluated:Patient Re-evaluated prior to induction Oxygen Delivery Method: Circle system utilized Preoxygenation: Pre-oxygenation with 100% oxygen Induction Type: IV induction Ventilation: Mask ventilation without difficulty Laryngoscope Size: Mac and 3 Grade View: Grade I Nasal Tubes: Right, Nasal prep performed, Nasal Rae and Magill forceps - small, utilized Tube size: 6.5 mm Number of attempts: 2 Placement Confirmation: ETT inserted through vocal cords under direct vision,  positive ETCO2 and breath sounds checked- equal and bilateral Tube secured with: Tape Dental Injury: Teeth and Oropharynx as per pre-operative assessment

## 2019-06-06 NOTE — Anesthesia Preprocedure Evaluation (Signed)
Anesthesia Evaluation  Patient identified by MRN, date of birth, ID band Patient awake    Reviewed: Allergy & Precautions, NPO status , Patient's Chart, lab work & pertinent test results  Airway Mallampati: III  TM Distance: >3 FB Neck ROM: Full    Dental  (+) Teeth Intact, Dental Advisory Given, Poor Dentition   Pulmonary neg pulmonary ROS,    Pulmonary exam normal        Cardiovascular hypertension,  Rhythm:Regular Rate:Normal     Neuro/Psych PSYCHIATRIC DISORDERS negative neurological ROS     GI/Hepatic Neg liver ROS, GERD  Medicated,  Endo/Other  Hypothyroidism   Renal/GU negative Renal ROS     Musculoskeletal negative musculoskeletal ROS (+)   Abdominal Normal abdominal exam  (+)   Peds  Hematology negative hematology ROS (+)   Anesthesia Other Findings   Reproductive/Obstetrics                             Anesthesia Physical Anesthesia Plan  ASA: II  Anesthesia Plan: General   Post-op Pain Management:    Induction: Intravenous  PONV Risk Score and Plan: 3 and Ondansetron, Midazolam, Dexamethasone and Treatment may vary due to age or medical condition  Airway Management Planned: Nasal ETT  Additional Equipment: None  Intra-op Plan:   Post-operative Plan: Extubation in OR  Informed Consent: I have reviewed the patients History and Physical, chart, labs and discussed the procedure including the risks, benefits and alternatives for the proposed anesthesia with the patient or authorized representative who has indicated his/her understanding and acceptance.     Dental advisory given  Plan Discussed with: CRNA  Anesthesia Plan Comments:         Anesthesia Quick Evaluation

## 2019-06-06 NOTE — Anesthesia Postprocedure Evaluation (Signed)
Anesthesia Post Note  Patient: Justin Johnson  Procedure(s) Performed: Recall Exam, Full Mouth X-Ray with Caries decay and fractured Teeth noted. Fractured teeth # 2, 24  Full Mouth debriedment Extractions 3, 24 2 Carpules of 1:100000 2% Lidocaine with Epinephrine. 3-0 Chromic Gut Suture with Gel Foam in extraction site. Amalgum # 20 MOD, 21 MO Glass Ionomer # 22 MF, 23 MFD, 28 F, 6 MF, 7 DF (N/A Mouth)     Patient location during evaluation: PACU Anesthesia Type: General Level of consciousness: awake and alert Pain management: pain level controlled Vital Signs Assessment: post-procedure vital signs reviewed and stable Respiratory status: spontaneous breathing, nonlabored ventilation, respiratory function stable and patient connected to nasal cannula oxygen Cardiovascular status: blood pressure returned to baseline and stable Postop Assessment: no apparent nausea or vomiting Anesthetic complications: no    Last Vitals:  Vitals:   06/06/19 1235 06/06/19 1249  BP:  128/88  Pulse:  (!) 109  Resp:  16  Temp: 36.7 C   SpO2:  100%    Last Pain:  Vitals:   06/06/19 0756  PainSc: 0-No pain                 Effie Berkshire

## 2019-06-06 NOTE — Op Note (Signed)
NAME: Justin Johnson, Justin Johnson MEDICAL RECORD [id number] ACCOUNT [id number] DATE OF BIRTH:Jan 13, 1971 FACILITY: [facility] LOCATION: [facility] PHYSICIAN:Keerthi Hazell E. Johney Maine., DDS  OPERATIVE REPORT  DATE OF PROCEDURE:  06/06/2019  PREOPERATIVE DIAGNOSIS:  Dental caries/behavior management issues due to intellectual/developmental disabilities.  Dental care was provided in the operating room for medically necessary treatment.  OPERATION:  Full mouth oral rehabilitation including exam, x-rays, cleaning, extractions and operative.  SURGEON:  Jeralene Peters. DDS  ASSISTANT:  Iverson Alamin and hospital staff.  ANESTHESIA:  General.  PROCEDURE:  The patient was brought into the operating room and placed in the supine position.  General anesthesia was administered via nasal intubation.  The patient was prepped and draped in the usual manner for an intraoral general dentistry  procedure.  The oropharynx was suctioned and a moistened posterior throat pack was placed.  Full intraoral exam including all hard and soft tissues was performed.  This was a recall exam.  Soft tissue exam, the floor of the mouth, buccal mucosa, soft  palate, hard palate, tongue, gingival and frenum attachment, all within normal limits with regard to size, color and consistency.  Hard tissue exam reveals:  #1, missing; #2, caries; #3, present; #4 and 5, missing; 6 through 11, present; #6, caries; #7,  caries; #12, missing; #12 through 18, missing; #19, present; # 20, caries; #21, caries; #22, caries; #23, caries; #24, root tip; #25 through 28, present; #29 through 32, missing.  Full mouth series of digital x-rays was taken and decay noted on x-rays  and fractured #2 and 24 noted.  Full mouth debridement was completed.  Operative care was provided with a high-speed handpiece #55, 57 bur and #4 bur and a slow speed handpiece #4 bur.  Decay on #6, MF,was coronal dentin smooth surface, decay on #7,DF, coronal  smooth dentin, #20,MOD,  coronal smooth dentin, #21,MO, coronal smooth dentin, #22,MFD, coronal smooth dentin, #23,MF, coronal smooth dentin and #28,F, coronal smooth.  Amalgams were placed on #20, #21.  Glass ionomers were placed on #6, #7, #22, #23 and #28.  Extractions were done on #2 and 24.  Gelfoam was inserted into the extraction sites with a 3-0 chromic suture closure.  Two carpules of 2% lidocaine with 1:100,000 epinephrine was given and an estimated blood loss of 10 mL.  The mouth was  suctioned dry and a posterior throat pack was carefully removed with constant suction.  The patient was awakened in the OR, transferred to the recovery room in good condition.  An extraction sheet was given to the patient's mother and she signed it.  VN/NUANCE  D:06/06/2019 T:06/06/2019 JOB:[id number]

## 2019-06-06 NOTE — H&P (Signed)
H&P reviewed. Patient stable for surgery. Sheran Spine

## 2019-06-06 NOTE — Interval H&P Note (Signed)
Anesthesia H&P Update: History and Physical Exam reviewed; patient is OK for planned anesthetic and procedure. ? ?

## 2019-06-06 NOTE — Progress Notes (Signed)
Spoke with Dr. Smith Robert regarding patient, informed that we were unable to get his vitals signs or EKG, patient was very agitated with attempts.

## 2019-12-16 ENCOUNTER — Other Ambulatory Visit: Payer: Self-pay

## 2019-12-16 ENCOUNTER — Emergency Department (HOSPITAL_BASED_OUTPATIENT_CLINIC_OR_DEPARTMENT_OTHER): Payer: Medicare Other

## 2019-12-16 ENCOUNTER — Encounter (HOSPITAL_BASED_OUTPATIENT_CLINIC_OR_DEPARTMENT_OTHER): Payer: Self-pay

## 2019-12-16 ENCOUNTER — Emergency Department (HOSPITAL_BASED_OUTPATIENT_CLINIC_OR_DEPARTMENT_OTHER)
Admission: EM | Admit: 2019-12-16 | Discharge: 2019-12-16 | Disposition: A | Discharge status: Home / Self Care | Payer: Medicare Other | Attending: Emergency Medicine | Admitting: Emergency Medicine

## 2019-12-16 DIAGNOSIS — Z79899 Other long term (current) drug therapy: Secondary | ICD-10-CM | POA: Insufficient documentation

## 2019-12-16 DIAGNOSIS — E039 Hypothyroidism, unspecified: Secondary | ICD-10-CM | POA: Diagnosis not present

## 2019-12-16 DIAGNOSIS — F840 Autistic disorder: Secondary | ICD-10-CM | POA: Diagnosis not present

## 2019-12-16 DIAGNOSIS — I10 Essential (primary) hypertension: Secondary | ICD-10-CM | POA: Insufficient documentation

## 2019-12-16 DIAGNOSIS — K5900 Constipation, unspecified: Secondary | ICD-10-CM

## 2019-12-16 DIAGNOSIS — R066 Hiccough: Secondary | ICD-10-CM | POA: Diagnosis present

## 2019-12-16 MED ORDER — METOCLOPRAMIDE HCL 10 MG PO TABS
10.0000 mg | ORAL_TABLET | Freq: Once | ORAL | Status: AC
  Administered 2019-12-16: 10 mg via ORAL
  Filled 2019-12-16: qty 1

## 2019-12-16 MED ORDER — GLYCERIN (LAXATIVE) 2.1 G RE SUPP
1.0000 | Freq: Once | RECTAL | Status: AC
  Administered 2019-12-16: 1 via RECTAL
  Filled 2019-12-16: qty 1

## 2019-12-16 MED ORDER — ALUM & MAG HYDROXIDE-SIMETH 200-200-20 MG/5ML PO SUSP
30.0000 mL | Freq: Once | ORAL | Status: AC
  Administered 2019-12-16: 30 mL via ORAL
  Filled 2019-12-16: qty 30

## 2019-12-16 MED ORDER — LIDOCAINE VISCOUS HCL 2 % MT SOLN
15.0000 mL | Freq: Once | OROMUCOSAL | Status: AC
  Administered 2019-12-16: 15 mL via ORAL
  Filled 2019-12-16: qty 15

## 2019-12-16 MED ORDER — BACLOFEN 10 MG PO TABS
10.0000 mg | ORAL_TABLET | Freq: Three times a day (TID) | ORAL | Status: DC
  Filled 2019-12-16: qty 1

## 2019-12-16 MED ORDER — PROCHLORPERAZINE EDISYLATE 10 MG/2ML IJ SOLN
10.0000 mg | Freq: Once | INTRAMUSCULAR | Status: AC
  Administered 2019-12-16: 10 mg via INTRAMUSCULAR
  Filled 2019-12-16: qty 2

## 2019-12-16 NOTE — Discharge Instructions (Addendum)
Give suppository when you get home. Manage constipation with the lactulose as directed previously. Follow up with your GI for management of reflux/hiccups.

## 2019-12-16 NOTE — ED Provider Notes (Signed)
[facility] EMERGENCY DEPARTMENT Provider Note   CSN: [id number] Arrival date & time: 12/16/19  1007     History Chief Complaint  Patient presents with  . Hiccups    Justin Johnson is a 49 y.o. male.  49 year old male with past medical history of profound intellectual disability, hiccups, hypertension, acid reflux, autism brought in by mom with report of hiccups.  Patient lives at a group facility, mother was contacted on Friday with report of hiccups.  Patient has a history of reflux causing his hiccups, his omeprazole was decreased from 40 mg daily to 20 mg daily about 1 year ago.  Omeprazole was increased back to 40 mg with onset of this episode as well as given 25 mg of Thorazine twice daily.  Per protocol, if hiccups continue after 2 days of Thorazine, patient is to be evaluated.  Patient is indicated to his mother that he has discomfort in his abdomen as well as through the middle of his chest.  Patient manages bowels independently, unknown last bowel movement or if he is constipated, patient did have 1 episode of spitting up last night after eating cake with chocolate icing on it.  Patient's mother is unsure if his abdominal discomfort is coming from his constant hiccups for the past 3 days.  Patient has gained 10 pounds since January, mother has been discussing this with facility regarding dietary changes.  Otherwise no other acute changes in his condition.        Past Medical History:  Diagnosis Date  . Acid reflux   . Autism    distruptive behaviors  . Cataracts, bilateral    MD just watching   . Hiccoughs   . Hypertension   . Hypotension    Nurse Victorino Dike denies this dx - pt is HTN  . Hypothyroidism   . Profound intellectual disability   . Thyroid disease     There are no problems to display for this patient.   Past Surgical History:  Procedure Laterality Date  . DENTAL RESTORATION/EXTRACTION WITH X-RAY Bilateral 04/26/2017   Procedure: RECALL EXAM,  FULL MOUTH XRAYS, FULL MOUTH DEBRIDMENT, NUMBER TWELVE EXTRACTION, GLASS IONOMERS NUMBER SIX (F) NUMBER THREE (M) NUMBER TWENTY-THREE (F) NUMBER TWO (O);  Surgeon: Boneta Lucks, DDS;  Location: [facility] OR;  Service: Oral Surgery;  Laterality: Bilateral;  . DENTAL RESTORATION/EXTRACTION WITH X-RAY N/A 06/06/2019   Procedure: Recall Exam, Full Mouth X-Ray with Caries decay and fractured Teeth noted. Fractured teeth # 2, 24  Full Mouth debriedment Extractions 3, 24 2 Carpules of 1:100000 2% Lidocaine with Epinephrine. 3-0 Chromic Gut Suture with Gel Foam in extraction site. Amalgum # 20 MOD, 21 MO Glass Ionomer # 22 MF, 23 MFD, 28 F, 6 MF, 7 DF;  Surgeon: Boneta Lucks, DDS;  Location: [facility] OR;  Service: Denti  . DENTAL SURGERY    . TONSILLECTOMY         No family history on file.  Social History   Tobacco Use  . Smoking status: Never Smoker  . Smokeless tobacco: Never Used  Vaping Use  . Vaping Use: Never used  Substance Use Topics  . Alcohol use: No  . Drug use: No    Home Medications Prior to Admission medications   Medication Sig Start Date End Date Taking? Authorizing Provider  ARIPiprazole (ABILIFY) 2 MG tablet Take 2 mg by mouth 2 (two) times daily. (0800 & 2000)    [provider]  chlorproMAZINE (THORAZINE) 25 MG tablet Take 1 tablet (  25 mg total) by mouth 3 (three) times daily as needed for hiccoughs. Patient taking differently: Take 25 mg by mouth 2 (two) times daily as needed for hiccoughs.  02/01/15   Orpah Greek, MD  Cholecalciferol (VITAMIN D3) 50 MCG (2000 UT) TABS Take 2,000 Units by mouth daily.    [provider]  clindamycin (CLEOCIN T) 1 % lotion Apply 1 application topically every Monday, Wednesday, and Friday. Applied to face/back    [provider]  desonide (DESOWEN) 0.05 % lotion Apply 1 application topically daily. Applied to affected area(s) of face    [provider]  EPINEPHrine 0.3 mg/0.3 mL IJ SOAJ injection  Inject 0.3 mg into the muscle as needed for anaphylaxis.    [provider]  famotidine (PEPCID) 40 MG/5ML suspension Take 20 mg by mouth every evening.    [provider]  hydrogen peroxide 3 % external solution Apply topically daily. Instill 2 drops into ear canal after showers    [provider]  ketoconazole (NIZORAL) 2 % shampoo Apply 1 application topically 3 (three) times a week. Apply to scalp, cheeks and outer ears .  Allow to sit on skin 5 mins before rinsing from scalp    [provider]  lactulose (CHRONULAC) 10 GM/15ML solution Take 10 g by mouth daily as needed (constipation.).     [provider]  levothyroxine (SYNTHROID, LEVOTHROID) 50 MCG tablet Take 50 mcg by mouth daily before breakfast.    [provider]  Lubricants (K-Y JELLY EX) Apply 1 application topically as needed (for personal use).    [provider]  NIFEdipine (PROCARDIA) 20 MG capsule Take 20 mg by mouth 2 (two) times daily. Hold for BP below 90/60    [provider]  omeprazole (PRILOSEC) 20 MG capsule Take 1 capsule (20 mg total) by mouth daily. 03/07/15   Waynetta Pean, PA-C  Vitamins A & D (BAZA CLEAR) OINT Place 1 application rectally 2 (two) times daily. Applied to peri/scrotum areas    [provider]    Allergies    Bee venom  Review of Systems   Review of Systems  Unable to perform ROS: Other (Intellectual ability limits ability to obtain reliable history and review of systems.)    Physical Exam Updated Vital Signs BP (!) 151/91 (BP Location: Right Arm)   Pulse 98   Temp 98 F (36.7 C) (Oral)   Resp 18   Ht 5' (1.524 m)   Wt 73.5 kg   SpO2 100%   BMI 31.64 kg/m   Physical Exam Vitals and nursing note reviewed.  Constitutional:      General: He is not in acute distress.    Appearance: He is well-developed. He is not diaphoretic.  HENT:     Head: Normocephalic and atraumatic.  Cardiovascular:     Rate and  Rhythm: Normal rate and regular rhythm.     Pulses: Normal pulses.     Heart sounds: Normal heart sounds.  Pulmonary:     Effort: Pulmonary effort is normal.     Breath sounds: Normal breath sounds.  Abdominal:     General: Bowel sounds are decreased.     Palpations: Abdomen is soft.     Tenderness: There is no abdominal tenderness.     Comments: Patient will not lie on stretcher for exam, abdomen is nontender, bowel sounds diminished in all quadrants.  Musculoskeletal:     Right lower leg: No edema.     Left  lower leg: No edema.  Skin:    General: Skin is warm and dry.     Findings: No erythema or rash.  Neurological:     Mental Status: He is alert. Mental status is at baseline.  Psychiatric:        Behavior: Behavior normal.     ED Results / Procedures / Treatments   Labs (all labs ordered are listed, but only abnormal results are displayed) Labs Reviewed - No data to display  EKG EKG Interpretation  Date/Time:  Monday December 16 2019 11:25:35 EDT Ventricular Rate:  102 PR Interval:    QRS Duration: 88 QT Interval:  350 QTC Calculation: 456 R Axis:   -13 Text Interpretation: Sinus tachycardia Baseline wander in lead(s) V3 V4 V5 No STEMI visible, unable to obtain steady ECG due ot persistent hiccups Confirmed by Alvester Chou [phone]) on 12/16/2019 12:04:02 PM   Radiology DG Abd Acute W/Chest  Result Date: 12/16/2019 CLINICAL DATA:  Hiccups, constipation EXAM: DG ABDOMEN ACUTE W/ 1V CHEST COMPARISON:  None FINDINGS: Cardiomediastinal contours and hilar structures are normal. Lungs are clear.  No sign of pleural effusion. No free air beneath either RIGHT or LEFT hemidiaphragm. Collection of bowel loops in the RIGHT abdomen with moderate dilation. This is likely redundant sigmoid. In the transverse colon there is a caliber change suggestion of mild haustral thickening. No sign of organomegaly.  No abnormal calcifications. Visualized skeletal structures on limited assessment  are unremarkable. IMPRESSION: No acute cardiopulmonary disease. Findings that suggest constipation but with question of mid transverse colon colonic thickening that raises the question of colitis. No free air. Electronically Signed   By: Donzetta Kohut M.D.   On: 12/16/2019 11:22    Procedures Procedures (including critical care time)  Medications Ordered in ED Medications  baclofen (LIORESAL) tablet 10 mg (has no administration in time range)  alum & mag hydroxide-simeth (MAALOX/MYLANTA) 200-200-20 MG/5ML suspension 30 mL (30 mLs Oral Given 12/16/19 1118)    And  lidocaine (XYLOCAINE) 2 % viscous mouth solution 15 mL (15 mLs Oral Given 12/16/19 1118)  metoCLOPramide (REGLAN) tablet 10 mg (10 mg Oral Given 12/16/19 1141)  prochlorperazine (COMPAZINE) injection 10 mg (10 mg Intramuscular Given 12/16/19 1346)  Glycerin (Adult) 2.1 g suppository 1 suppository (1 suppository Rectal Provided for home use 12/16/19 1345)    ED Course  I have reviewed the triage vital signs and the nursing notes.  Pertinent labs & imaging results that were available during my care of the patient were reviewed by me and considered in my medical decision making (see chart for details).  Clinical Course as of Dec 15 1429  Mon Dec 16, 2019  9732 49 year old male brought in by mom with intractable hiccups, history of same for the past 10 years or so, generally controlled with Thorazine however if Thorazine does not stop symptoms within 2 days patient is brought in for evaluation.  Episode today is similar to prior, patient's mom provides history.  During initial evaluation, patient did not have any hiccups however after exam, did begin having gaps again during his EKG and continued thereafter.  Patient was given a GI cocktail without improvement, was then given Reglan. X-ray shows constipation which may be causing his abdominal discomfort, discussed means with mom, does take lactulose daily, will add glycerin suppository upon  arrival back home. Discussed management of hiccups with mom, patient's mom called the hospital prior to arrival today and was under the understanding that we would have Thorazine here in  the ER to give him IM for his hiccups however this is not available at this emergency room.  Discussed with the pharmacist who recommended Compazine.  Plan is to give Compazine IM and discharged home to continue management at home and follow-up.  Case was managed with Dr. Renaye Rakers, ER attending who agrees with plan of care.   [LM]    Clinical Course User Index [LM] Alden Hipp   MDM Rules/Calculators/A&P                         Final Clinical Impression(s) / ED Diagnoses Final diagnoses:  Intractable hiccups    Rx / DC Orders ED Discharge Orders    None       Jeannie Fend, PA-C 12/16/19 1431    Terald Sleeper, MD 12/16/19 1753

## 2019-12-16 NOTE — ED Triage Notes (Signed)
Pt lives in a group home arrives with mother who reports he has been having hiccups since Thursday or Friday. She picked pt up from group home on Saturday has been giving thorazine and increased omeprazole to 40 mg, per PCP.

## 2020-11-06 ENCOUNTER — Encounter (HOSPITAL_COMMUNITY): Payer: Self-pay

## 2020-11-06 ENCOUNTER — Other Ambulatory Visit: Payer: Self-pay

## 2020-11-06 ENCOUNTER — Emergency Department (HOSPITAL_COMMUNITY)
Admission: EM | Admit: 2020-11-06 | Discharge: 2020-11-07 | Disposition: A | Discharge status: Home / Self Care | Payer: Medicare Other | Attending: Emergency Medicine | Admitting: Emergency Medicine

## 2020-11-06 ENCOUNTER — Emergency Department (HOSPITAL_COMMUNITY): Payer: Medicare Other

## 2020-11-06 DIAGNOSIS — I10 Essential (primary) hypertension: Secondary | ICD-10-CM | POA: Diagnosis not present

## 2020-11-06 DIAGNOSIS — F840 Autistic disorder: Secondary | ICD-10-CM | POA: Diagnosis not present

## 2020-11-06 DIAGNOSIS — E039 Hypothyroidism, unspecified: Secondary | ICD-10-CM | POA: Diagnosis not present

## 2020-11-06 DIAGNOSIS — Z20822 Contact with and (suspected) exposure to covid-19: Secondary | ICD-10-CM | POA: Insufficient documentation

## 2020-11-06 DIAGNOSIS — Z79899 Other long term (current) drug therapy: Secondary | ICD-10-CM | POA: Insufficient documentation

## 2020-11-06 DIAGNOSIS — N3000 Acute cystitis without hematuria: Secondary | ICD-10-CM

## 2020-11-06 DIAGNOSIS — R509 Fever, unspecified: Secondary | ICD-10-CM | POA: Diagnosis not present

## 2020-11-06 LAB — URINALYSIS, ROUTINE W REFLEX MICROSCOPIC
Bilirubin Urine: NEGATIVE
Glucose, UA: NEGATIVE mg/dL
Ketones, ur: 5 mg/dL — AB
Nitrite: POSITIVE — AB
Protein, ur: NEGATIVE mg/dL
Specific Gravity, Urine: 1.021 (ref 1.005–1.030)
WBC, UA: >50 WBC/hpf — ABNORMAL HIGH (ref 0–5)
pH: 5 (ref 5.0–8.0)

## 2020-11-06 LAB — CBC WITH DIFFERENTIAL/PLATELET
Abs Immature Granulocytes: 0.21 10*3/uL — ABNORMAL HIGH (ref 0.00–0.07)
Basophils Absolute: 0.1 10*3/uL (ref 0.0–0.1)
Basophils Relative: 0 %
Eosinophils Absolute: 0 10*3/uL (ref 0.0–0.5)
Eosinophils Relative: 0 %
HCT: 43.5 % (ref 39.0–52.0)
Hemoglobin: 14.7 g/dL (ref 13.0–17.0)
Immature Granulocytes: 1 %
Lymphocytes Relative: 7 %
Lymphs Abs: 1.5 10*3/uL (ref 0.7–4.0)
MCH: 32.1 pg (ref 26.0–34.0)
MCHC: 33.8 g/dL (ref 30.0–36.0)
MCV: 95 fL (ref 80.0–100.0)
Monocytes Absolute: 1.7 10*3/uL — ABNORMAL HIGH (ref 0.1–1.0)
Monocytes Relative: 8 %
Neutro Abs: 17.8 10*3/uL — ABNORMAL HIGH (ref 1.7–7.7)
Neutrophils Relative %: 84 %
Platelets: 170 10*3/uL (ref 150–400)
RBC: 4.58 MIL/uL (ref 4.22–5.81)
RDW: 12.8 % (ref 11.5–15.5)
WBC: 21.2 10*3/uL — ABNORMAL HIGH (ref 4.0–10.5)
nRBC: 0 % (ref 0.0–0.2)

## 2020-11-06 LAB — COMPREHENSIVE METABOLIC PANEL
ALT: 21 U/L (ref 0–44)
AST: 22 U/L (ref 15–41)
Albumin: 4 g/dL (ref 3.5–5.0)
Alkaline Phosphatase: 90 U/L (ref 38–126)
Anion gap: 8 (ref 5–15)
BUN: 20 mg/dL (ref 6–20)
CO2: 25 mmol/L (ref 22–32)
Calcium: 9.2 mg/dL (ref 8.9–10.3)
Chloride: 105 mmol/L (ref 98–111)
Creatinine, Ser: 1.12 mg/dL (ref 0.61–1.24)
GFR, Estimated: >60 mL/min (ref >60)
Glucose, Bld: 120 mg/dL — ABNORMAL HIGH (ref 70–99)
Potassium: 3.4 mmol/L — ABNORMAL LOW (ref 3.5–5.1)
Sodium: 138 mmol/L (ref 135–145)
Total Bilirubin: 0.6 mg/dL (ref 0.3–1.2)
Total Protein: 7.3 g/dL (ref 6.5–8.1)

## 2020-11-06 LAB — RESP PANEL BY RT-PCR (FLU A&B, COVID) ARPGX2
Influenza A by PCR: NEGATIVE
Influenza B by PCR: NEGATIVE
SARS Coronavirus 2 by RT PCR: NEGATIVE

## 2020-11-06 MED ORDER — SODIUM CHLORIDE 0.9 % IV BOLUS
1000.0000 mL | Freq: Once | INTRAVENOUS | Status: AC
  Administered 2020-11-06: 1000 mL via INTRAVENOUS

## 2020-11-06 MED ORDER — SODIUM CHLORIDE 0.9 % IV SOLN
1.0000 g | Freq: Once | INTRAVENOUS | Status: AC
  Administered 2020-11-06: 1 g via INTRAVENOUS
  Filled 2020-11-06: qty 10

## 2020-11-06 MED ORDER — ARIPIPRAZOLE 2 MG PO TABS
2.0000 mg | ORAL_TABLET | Freq: Once | ORAL | Status: AC
  Administered 2020-11-06: 2 mg via ORAL
  Filled 2020-11-06: qty 1

## 2020-11-06 MED ORDER — LORAZEPAM 2 MG/ML IJ SOLN
1.0000 mg | Freq: Once | INTRAMUSCULAR | Status: AC
  Administered 2020-11-07: 1 mg via INTRAVENOUS
  Filled 2020-11-06: qty 1

## 2020-11-06 NOTE — ED Triage Notes (Signed)
Per EMS- patient is from a group home on [address]. Mother accompanied the patient to the ED. Group home reported a T. 102.2 and patient was given Tylenol at 1400 today. EMS reports a T. 98.3.

## 2020-11-06 NOTE — ED Provider Notes (Addendum)
Emergency Medicine Provider Triage Evaluation Note  Justin Johnson , a 50 y.o. male  was evaluated in triage.  Pt complains of fever 102 at group home, given Tylenol at [facility]. Found to be afebrile by EMS. Mom has noticed hands are a little flushed/swollen. Patient is not a reliable historian. No known sick contacts at the group home.   Review of Systems  Positive: fever Negative: cough  Physical Exam  BP 133/77 (BP Location: Right Arm)   Pulse (!) 119   Temp 98.1 F (36.7 C) (Oral)   Resp 18   SpO2 100%  Gen:   Awake, no distress   Resp:  Normal effort  MSK:   Moves extremities without difficulty  Other:  Lungs clear, HR elevated  Medical Decision Making  Medically screening exam initiated at 5:09 PM.  Appropriate orders placed.  Justin Johnson was informed that the remainder of the evaluation will be completed by another provider, this initial triage assessment does not replace that evaluation, and the importance of remaining in the ED until their evaluation is complete.  Discussed care with mom, patient is well appearing. Mom would like lab check.      Jeannie Fend, PA-C 11/06/20 1718    Jeannie Fend, PA-C 11/06/20 Justin Halsted, MD 11/06/20 [phone]

## 2020-11-06 NOTE — ED Provider Notes (Signed)
[facility]-EMERGENCY DEPT Provider Note   CSN: [id number] Arrival date & time: 11/06/20  1659     History Chief Complaint  Patient presents with  . Fever    Justin Johnson is a 50 y.o. male.  The history is provided by a parent.        Level 5 caveat due to intellectual disability.  Justin Johnson is a 50 y.o. male, with a history of autism, HTN, intellectual disability, presenting to the ED accompanied by his mother with fever reported by patient's group home noted today.  They reported a temperature of 102.2 F.  He received Tylenol around 12 PM today. Patient does not have any complaints, however, his mother states this is not unusual even when he is in pain.   Past Medical History:  Diagnosis Date  . Acid reflux   . Autism    distruptive behaviors  . Cataracts, bilateral    MD just watching   . Hiccoughs   . Hypertension   . Hypotension    Nurse Victorino Dike denies this dx - pt is HTN  . Hypothyroidism   . Profound intellectual disability   . Thyroid disease     There are no problems to display for this patient.   Past Surgical History:  Procedure Laterality Date  . DENTAL RESTORATION/EXTRACTION WITH X-RAY Bilateral 04/26/2017   Procedure: RECALL EXAM, FULL MOUTH XRAYS, FULL MOUTH DEBRIDMENT, NUMBER TWELVE EXTRACTION, GLASS IONOMERS NUMBER SIX (F) NUMBER THREE (M) NUMBER TWENTY-THREE (F) NUMBER TWO (O);  Surgeon: Boneta Lucks, DDS;  Location: [facility] OR;  Service: Oral Surgery;  Laterality: Bilateral;  . DENTAL RESTORATION/EXTRACTION WITH X-RAY N/A 06/06/2019   Procedure: Recall Exam, Full Mouth X-Ray with Caries decay and fractured Teeth noted. Fractured teeth # 2, 24  Full Mouth debriedment Extractions 3, 24 2 Carpules of 1:100000 2% Lidocaine with Epinephrine. 3-0 Chromic Gut Suture with Gel Foam in extraction site. Amalgum # 20 MOD, 21 MO Glass Ionomer # 22 MF, 23 MFD, 28 F, 6 MF, 7 DF;  Surgeon: Boneta Lucks, DDS;  Location: [facility] OR;   Service: Denti  . DENTAL SURGERY    . TONSILLECTOMY         History reviewed. No pertinent family history.  Social History   Tobacco Use  . Smoking status: Never Smoker  . Smokeless tobacco: Never Used  Vaping Use  . Vaping Use: Never used  Substance Use Topics  . Alcohol use: No  . Drug use: No    Home Medications Prior to Admission medications   Medication Sig Start Date End Date Taking? Authorizing Provider  ARIPiprazole (ABILIFY) 2 MG tablet Take 2 mg by mouth 2 (two) times daily. (0800 & 2000)    [provider]  chlorproMAZINE (THORAZINE) 25 MG tablet Take 1 tablet (25 mg total) by mouth 3 (three) times daily as needed for hiccoughs. Patient taking differently: Take 25 mg by mouth 2 (two) times daily as needed for hiccoughs.  02/01/15   Gilda Crease, MD  Cholecalciferol (VITAMIN D3) 50 MCG (2000 UT) TABS Take 2,000 Units by mouth daily.    [provider]  clindamycin (CLEOCIN T) 1 % lotion Apply 1 application topically every Monday, Wednesday, and Friday. Applied to face/back    [provider]  desonide (DESOWEN) 0.05 % lotion Apply 1 application topically daily. Applied to affected area(s) of face    [provider]  EPINEPHrine 0.3 mg/0.3 mL IJ SOAJ injection Inject 0.3 mg into the  muscle as needed for anaphylaxis.    [provider]  famotidine (PEPCID) 40 MG/5ML suspension Take 20 mg by mouth every evening.    [provider]  hydrogen peroxide 3 % external solution Apply topically daily. Instill 2 drops into ear canal after showers    [provider]  ketoconazole (NIZORAL) 2 % shampoo Apply 1 application topically 3 (three) times a week. Apply to scalp, cheeks and outer ears .  Allow to sit on skin 5 mins before rinsing from scalp    [provider]  lactulose (CHRONULAC) 10 GM/15ML solution Take 10 g by mouth daily as needed (constipation.).     [provider]  levothyroxine  (SYNTHROID, LEVOTHROID) 50 MCG tablet Take 50 mcg by mouth daily before breakfast.    [provider]  Lubricants (K-Y JELLY EX) Apply 1 application topically as needed (for personal use).    [provider]  NIFEdipine (PROCARDIA) 20 MG capsule Take 20 mg by mouth 2 (two) times daily. Hold for BP below 90/60    [provider]  omeprazole (PRILOSEC) 20 MG capsule Take 1 capsule (20 mg total) by mouth daily. 03/07/15   Everlene Farrier, PA-C  Vitamins A & D (BAZA CLEAR) OINT Place 1 application rectally 2 (two) times daily. Applied to peri/scrotum areas    [provider]    Allergies    Bee venom  Review of Systems   Review of Systems  Unable to perform ROS: Other (Intellectual disability)    Physical Exam Updated Vital Signs BP 130/88   Pulse 95   Temp 98.1 F (36.7 C) (Oral)   Resp 18   Ht 5\' 1"  (1.549 m)   Wt 74.8 kg Comment: per mother  SpO2 98%   BMI 31.18 kg/m   Physical Exam Vitals and nursing note reviewed.  Constitutional:      General: He is not in acute distress.    Appearance: He is well-developed. He is not diaphoretic.  HENT:     Head: Normocephalic and atraumatic.     Mouth/Throat:     Mouth: Mucous membranes are moist.     Pharynx: Oropharynx is clear.  Eyes:     Conjunctiva/sclera: Conjunctivae normal.  Cardiovascular:     Rate and Rhythm: Normal rate and regular rhythm.     Pulses: Normal pulses.          Radial pulses are 2+ on the right side and 2+ on the left side.  Pulmonary:     Effort: Pulmonary effort is normal. No respiratory distress.  Abdominal:     Palpations: Abdomen is soft.     Tenderness: There is no abdominal tenderness. There is no right CVA tenderness, left CVA tenderness or guarding.  Musculoskeletal:     Cervical back: Neck supple.     Right lower leg: No edema.     Left lower leg: No edema.  Skin:    General: Skin is warm and dry.  Neurological:     Mental Status: He is alert.   Psychiatric:        Mood and Affect: Mood and affect normal.        Speech: Speech normal.        Behavior: Behavior normal.     ED Results / Procedures / Treatments   Labs (all labs ordered are listed, but only abnormal results are displayed) Labs Reviewed  COMPREHENSIVE METABOLIC PANEL - Abnormal; Notable for the following components:  Result Value   Potassium 3.4 (*)    Glucose, Bld 120 (*)    All other components within normal limits  CBC WITH DIFFERENTIAL/PLATELET - Abnormal; Notable for the following components:   WBC 21.2 (*)    Neutro Abs 17.8 (*)    Monocytes Absolute 1.7 (*)    Abs Immature Granulocytes 0.21 (*)    All other components within normal limits  URINALYSIS, ROUTINE W REFLEX MICROSCOPIC - Abnormal; Notable for the following components:   APPearance HAZY (*)    Hgb urine dipstick SMALL (*)    Ketones, ur 5 (*)    Nitrite POSITIVE (*)    Leukocytes,Ua LARGE (*)    WBC, UA >50 (*)    Bacteria, UA MANY (*)    All other components within normal limits  RESP PANEL BY RT-PCR (FLU A&B, COVID) ARPGX2    EKG None  Radiology No results found.  Procedures Procedures   Medications Ordered in ED Medications - No data to display  ED Course  I have reviewed the triage vital signs and the nursing notes.  Pertinent labs & imaging results that were available during my care of the patient were reviewed by me and considered in my medical decision making (see chart for details).    MDM Rules/Calculators/A&P                          Patient presents from his group home with reported fever. Patient is nontoxic appearing, afebrile here in the ED, not tachycardic, not tachypneic, not hypotensive, maintains excellent SPO2 on room air, and is in no apparent distress.   I have reviewed the patient's chart to obtain more information.   I reviewed and interpreted the patient's labs. Leukocytosis.  Evidence of UTI on UA. Patient was unable to tolerate the  initial attempt at the CT scan.  We will order Ativan and reattempt the scan.   End of shift patient care handoff report given to [employer], PA-C. Plan: Patient awaiting CT.  Final Clinical Impression(s) / ED Diagnoses Final diagnoses:  None    Rx / DC Orders ED Discharge Orders    None       Concepcion Living 11/07/20 0015    Mancel Bale, MD 11/09/20 1556

## 2020-11-07 ENCOUNTER — Emergency Department (HOSPITAL_COMMUNITY): Payer: Medicare Other

## 2020-11-07 DIAGNOSIS — R509 Fever, unspecified: Secondary | ICD-10-CM | POA: Diagnosis not present

## 2020-11-07 MED ORDER — CEPHALEXIN 500 MG PO CAPS
500.0000 mg | ORAL_CAPSULE | Freq: Two times a day (BID) | ORAL | 0 refills | Status: DC
Start: 2020-11-07 — End: 2021-08-30

## 2020-11-07 NOTE — ED Provider Notes (Signed)
Patient signed out to me at shift change.  He is reassessed.  He is alert and oriented and at his baseline.  He is accompanied by his mother.  Had reported fever earlier today.  Urinalysis consistent with infection.  CT shows no stone.  He appears nontoxic.  He is given a dose of Rocephin in the ED.  Keflex for home.  Return precautions discussed.   Roxy Horseman, PA-C 11/07/20 8421    Glynn Octave, MD 11/07/20 [phone]

## 2020-11-07 NOTE — ED Notes (Signed)
Declined vital signs for now. Will retry after CT.

## 2020-11-09 LAB — URINE CULTURE: Culture: >=100000 — AB

## 2020-11-10 ENCOUNTER — Telehealth: Payer: Self-pay | Admitting: Emergency Medicine

## 2020-11-10 NOTE — Telephone Encounter (Signed)
Post ED Visit - Positive Culture Follow-up  Culture report reviewed by antimicrobial stewardship pharmacist: [facility] []  , Pharm.D. []  Enzo Bi, Pharm.D., BCPS AQ-ID []  , Pharm.D., BCPS []  Celedonio Miyamoto, .D., BCPS []  [place], .D., BCPS, AAHIVP []  Georgina Pillion, Pharm.D., BCPS, AAHIVP []  [address], PharmD, BCPS []  , PharmD, BCPS []  [place], PharmD, BCPS []  [address], PharmD []  , PharmD, BCPS []  Estella Husk, PharmD  Pharmacy Team []  Lysle Pearl, PharmD []  , PharmD []  Phillips Climes, PharmD []  , Rph []  Agapito Games) , PharmD []  Verlan Friends, PharmD []  , PharmD []  Mervyn Gay, PharmD []  , PharmD []  Vinnie Level, PharmD []  [facility], PharmD []  , PharmD []  Len Childs, PharmD   Positive urine culture Treated with cephalexin, organism sensitive to the same and no further patient follow-up is required at this time.  11/10/2020, 11:54 AM

## 2021-08-30 ENCOUNTER — Ambulatory Visit: Payer: Self-pay | Admitting: Dentistry

## 2021-09-01 ENCOUNTER — Other Ambulatory Visit: Payer: Self-pay

## 2021-09-01 ENCOUNTER — Encounter (HOSPITAL_COMMUNITY): Payer: Self-pay

## 2021-09-01 NOTE — Anesthesia Preprocedure Evaluation (Addendum)
Anesthesia Evaluation  ?Patient identified by MRN, date of birth, ID band ?Patient awake ? ? ? ?Reviewed: ?Allergy & Precautions, NPO status , Patient's Chart, lab work & pertinent test results ? ?Airway ?Mallampati: III ? ?TM Distance: >3 FB ?Neck ROM: Full ? ? ? Dental ? ?(+) Teeth Intact, Dental Advisory Given, Poor Dentition, Chipped ?  ?Pulmonary ?neg pulmonary ROS,  ?  ?Pulmonary exam normal ? ? ? ? ? ? ? Cardiovascular ?hypertension, Pt. on medications ? ?Rhythm:Regular Rate:Normal ? ? ?  ?Neuro/Psych ?PSYCHIATRIC DISORDERS negative neurological ROS ?   ? GI/Hepatic ?Neg liver ROS, GERD  Medicated,  ?Endo/Other  ?Hypothyroidism  ? Renal/GU ?negative Renal ROS  ? ?  ?Musculoskeletal ?negative musculoskeletal ROS ?(+)  ? Abdominal ?Normal abdominal exam  (+)   ?Peds ? Hematology ?negative hematology ROS ?(+)   ?Anesthesia Other Findings ? ? Reproductive/Obstetrics ? ?  ? ? ? ? ? ? ? ? ? ? ? ? ? ?  ?  ? ? ? ? ? ? ? ?Anesthesia Physical ? ?Anesthesia Plan ? ?ASA: 2 ? ?Anesthesia Plan: General  ? ?Post-op Pain Management: Ofirmev IV (intra-op)* and Toradol IV (intra-op)*  ? ?Induction: Intravenous ? ?PONV Risk Score and Plan: 3 and Ondansetron, Midazolam, Dexamethasone and Treatment may vary due to age or medical condition ? ?Airway Management Planned: Nasal ETT ? ?Additional Equipment: None ? ?Intra-op Plan:  ? ?Post-operative Plan: Extubation in OR ? ?Informed Consent: I have reviewed the patients History and Physical, chart, labs and discussed the procedure including the risks, benefits and alternatives for the proposed anesthesia with the patient or authorized representative who has indicated his/her understanding and acceptance.  ? ? ? ?Dental advisory given and Consent reviewed with POA ? ?Plan Discussed with: CRNA ? ?Anesthesia Plan Comments:   ? ? ? ?Anesthesia Quick Evaluation ? ?

## 2021-09-01 NOTE — Progress Notes (Signed)
Interview done with the mom Delores, as the pt has a hx of a mental delay. ? ?PCP - Dr. Dayna Barker ? ?Cardiologist - Denies ? ?EP- Denies ? ?Endocrine- Denies ? ?Pulm- Denies ? ?Chest x-ray - Denies ? ?EKG - 09/02/21- Day of surgery ? ?Stress Test - Denies ? ?ECHO - Denies ? ?Cardiac Cath - Denies ? ?AICD- na ?PM- na ?LOOP- na ? ?Nerve Stimulator- Denies ? ?Dialysis- Denies ? ?Sleep Study - Denies ?CPAP - Denies ? ?LABS- 09/02/21: CBC, BMP ? ?ASA- Denies ? ?ERAS- No ? ?HA1C- Denies ? ?Anesthesia- No ? ?Delores denies the pt having chest pain, sob, or fever during the pre-op phone call. All instructions explained to [facility] with a verbal understanding of the material including: 7 days before surgery stop taking all Aspirin (unless instructed by your doctor) and Other Aspirin containing products, Vitamins, Fish oils, and Herbal medications. Also stop all NSAIDS i.e. Advil, Ibuprofen, Motrin, Aleve, Anaprox, Naproxen, BC, Goody Powders, and all Supplements. Delores also instructed for them to wear a mask and social distance if they go out. The opportunity to ask questions was provided. ? ? ?Coronavirus Screening ? ?Have you experienced the following symptoms:  ?Cough yes/no: No ?Fever (>100.25F)  yes/no: No ?Runny nose yes/no: No ?Sore throat yes/no: No ?Difficulty breathing/shortness of breath  yes/no: No ? ?Have you or a family member traveled in the last 14 days and where? yes/no: No ? ? ?If the patient indicates "YES" to the above questions, their PAT will be rescheduled to limit the exposure to others and, the surgeon will be notified. THE PATIENT WILL NEED TO BE ASYMPTOMATIC FOR 14 DAYS.  ? ?If the patient is not experiencing any of these symptoms, the PAT nurse will instruct them to NOT bring anyone with them to their appointment since they may have these symptoms or traveled as well.  ? ?Please remind your patients and families that hospital visitation restrictions are in effect and the importance of the restrictions.   ?  ?

## 2021-09-02 ENCOUNTER — Encounter (HOSPITAL_COMMUNITY): Payer: Self-pay

## 2021-09-02 ENCOUNTER — Ambulatory Visit (HOSPITAL_COMMUNITY)
Admission: RE | Admit: 2021-09-02 | Discharge: 2021-09-02 | Disposition: A | Discharge status: Home / Self Care | Payer: Medicare Other | Attending: Dentistry | Admitting: Dentistry

## 2021-09-02 ENCOUNTER — Other Ambulatory Visit: Payer: Self-pay

## 2021-09-02 ENCOUNTER — Encounter (HOSPITAL_COMMUNITY)
Admission: RE | Disposition: A | Discharge status: Home / Self Care | Payer: Self-pay | Source: Home / Self Care | Attending: Dentistry

## 2021-09-02 ENCOUNTER — Ambulatory Visit (HOSPITAL_BASED_OUTPATIENT_CLINIC_OR_DEPARTMENT_OTHER): Payer: Medicare Other | Admitting: Anesthesiology

## 2021-09-02 ENCOUNTER — Ambulatory Visit (HOSPITAL_COMMUNITY): Payer: Medicare Other | Admitting: Anesthesiology

## 2021-09-02 DIAGNOSIS — K219 Gastro-esophageal reflux disease without esophagitis: Secondary | ICD-10-CM | POA: Diagnosis not present

## 2021-09-02 DIAGNOSIS — F79 Unspecified intellectual disabilities: Secondary | ICD-10-CM | POA: Diagnosis not present

## 2021-09-02 DIAGNOSIS — K029 Dental caries, unspecified: Secondary | ICD-10-CM

## 2021-09-02 DIAGNOSIS — I10 Essential (primary) hypertension: Secondary | ICD-10-CM | POA: Insufficient documentation

## 2021-09-02 DIAGNOSIS — E039 Hypothyroidism, unspecified: Secondary | ICD-10-CM | POA: Insufficient documentation

## 2021-09-02 HISTORY — DX: Acne vulgaris: L70.0

## 2021-09-02 HISTORY — DX: Other complications of anesthesia, initial encounter: T88.59XA

## 2021-09-02 HISTORY — DX: Candidiasis of skin and nail: B37.2

## 2021-09-02 HISTORY — PX: DENTAL RESTORATION/EXTRACTION WITH X-RAY: SHX5796

## 2021-09-02 HISTORY — DX: Xerosis cutis: L85.3

## 2021-09-02 HISTORY — DX: Tinea cruris: B35.6

## 2021-09-02 LAB — CBC
HCT: 44.8 % (ref 39.0–52.0)
Hemoglobin: 15.3 g/dL (ref 13.0–17.0)
MCH: 32.5 pg (ref 26.0–34.0)
MCHC: 34.2 g/dL (ref 30.0–36.0)
MCV: 95.1 fL (ref 80.0–100.0)
Platelets: 163 10*3/uL (ref 150–400)
RBC: 4.71 MIL/uL (ref 4.22–5.81)
RDW: 12.7 % (ref 11.5–15.5)
WBC: 6.2 10*3/uL (ref 4.0–10.5)
nRBC: 0 % (ref 0.0–0.2)

## 2021-09-02 LAB — BASIC METABOLIC PANEL
Anion gap: 7 (ref 5–15)
BUN: 15 mg/dL (ref 6–20)
CO2: 25 mmol/L (ref 22–32)
Calcium: 9.2 mg/dL (ref 8.9–10.3)
Chloride: 108 mmol/L (ref 98–111)
Creatinine, Ser: 1 mg/dL (ref 0.61–1.24)
GFR, Estimated: >60 mL/min (ref >60)
Glucose, Bld: 102 mg/dL — ABNORMAL HIGH (ref 70–99)
Potassium: 3.6 mmol/L (ref 3.5–5.1)
Sodium: 140 mmol/L (ref 135–145)

## 2021-09-02 SURGERY — DENTAL RESTORATION/EXTRACTION WITH X-RAY
Anesthesia: General

## 2021-09-02 MED ORDER — FENTANYL CITRATE (PF) 100 MCG/2ML IJ SOLN: 25.0000 ug | INTRAMUSCULAR | Status: DC | PRN

## 2021-09-02 MED ORDER — EPHEDRINE 5 MG/ML INJ
INTRAVENOUS | Status: AC
  Filled 2021-09-02: qty 5

## 2021-09-02 MED ORDER — ONDANSETRON HCL 4 MG/2ML IJ SOLN
INTRAMUSCULAR | Status: AC
  Filled 2021-09-02: qty 2

## 2021-09-02 MED ORDER — OXYMETAZOLINE HCL 0.05 % NA SOLN
NASAL | Status: AC
  Filled 2021-09-02: qty 30

## 2021-09-02 MED ORDER — PROPOFOL 10 MG/ML IV BOLUS
INTRAVENOUS | Status: AC
  Filled 2021-09-02: qty 20

## 2021-09-02 MED ORDER — ROCURONIUM BROMIDE 10 MG/ML (PF) SYRINGE
PREFILLED_SYRINGE | INTRAVENOUS | Status: AC
  Filled 2021-09-02: qty 10

## 2021-09-02 MED ORDER — DEXMEDETOMIDINE (PRECEDEX) IN NS 20 MCG/5ML (4 MCG/ML) IV SYRINGE
PREFILLED_SYRINGE | INTRAVENOUS | Status: DC | PRN
  Administered 2021-09-02 (×2): 4 ug via INTRAVENOUS

## 2021-09-02 MED ORDER — MEPERIDINE HCL 25 MG/ML IJ SOLN: 6.2500 mg | INTRAMUSCULAR | Status: DC | PRN

## 2021-09-02 MED ORDER — CHLORHEXIDINE GLUCONATE 0.12 % MT SOLN: 15.0000 mL | Freq: Once | OROMUCOSAL | Status: DC

## 2021-09-02 MED ORDER — EPHEDRINE SULFATE-NACL 50-0.9 MG/10ML-% IV SOSY
PREFILLED_SYRINGE | INTRAVENOUS | Status: DC | PRN
  Administered 2021-09-02: 5 mg via INTRAVENOUS
  Administered 2021-09-02: 10 mg via INTRAVENOUS
  Administered 2021-09-02 (×2): 5 mg via INTRAVENOUS

## 2021-09-02 MED ORDER — DEXMEDETOMIDINE (PRECEDEX) IN NS 20 MCG/5ML (4 MCG/ML) IV SYRINGE
PREFILLED_SYRINGE | INTRAVENOUS | Status: AC
  Filled 2021-09-02: qty 5

## 2021-09-02 MED ORDER — ONDANSETRON HCL 4 MG/2ML IJ SOLN
INTRAMUSCULAR | Status: DC | PRN
  Administered 2021-09-02: 4 mg via INTRAVENOUS

## 2021-09-02 MED ORDER — ORAL CARE MOUTH RINSE: 15.0000 mL | Freq: Once | OROMUCOSAL | Status: DC

## 2021-09-02 MED ORDER — LACTATED RINGERS IV SOLN: INTRAVENOUS | Status: DC

## 2021-09-02 MED ORDER — PROPOFOL 10 MG/ML IV BOLUS
INTRAVENOUS | Status: DC | PRN
  Administered 2021-09-02: 150 mg via INTRAVENOUS

## 2021-09-02 MED ORDER — ACETAMINOPHEN 10 MG/ML IV SOLN
INTRAVENOUS | Status: DC | PRN
  Administered 2021-09-02: 1000 mg via INTRAVENOUS

## 2021-09-02 MED ORDER — LIDOCAINE 2% (20 MG/ML) 5 ML SYRINGE
INTRAMUSCULAR | Status: AC
  Filled 2021-09-02: qty 5

## 2021-09-02 MED ORDER — MIDAZOLAM HCL 2 MG/2ML IJ SOLN
INTRAMUSCULAR | Status: AC
  Filled 2021-09-02: qty 2

## 2021-09-02 MED ORDER — CHLORHEXIDINE GLUCONATE 0.12 % MT SOLN
15.0000 mL | Freq: Once | OROMUCOSAL | Status: AC
  Administered 2021-09-02: 06:00:00 15 mL via OROMUCOSAL
  Filled 2021-09-02: qty 15

## 2021-09-02 MED ORDER — OXYMETAZOLINE HCL 0.05 % NA SOLN
NASAL | Status: DC | PRN
  Administered 2021-09-02: 1 via TOPICAL

## 2021-09-02 MED ORDER — DEXAMETHASONE SODIUM PHOSPHATE 10 MG/ML IJ SOLN
INTRAMUSCULAR | Status: AC
  Filled 2021-09-02: qty 1

## 2021-09-02 MED ORDER — SUCCINYLCHOLINE CHLORIDE 200 MG/10ML IV SOSY
PREFILLED_SYRINGE | INTRAVENOUS | Status: DC | PRN
  Administered 2021-09-02: 100 mg via INTRAVENOUS

## 2021-09-02 MED ORDER — DEXAMETHASONE SODIUM PHOSPHATE 10 MG/ML IJ SOLN
INTRAMUSCULAR | Status: DC | PRN
  Administered 2021-09-02: 10 mg via INTRAVENOUS

## 2021-09-02 MED ORDER — KETOROLAC TROMETHAMINE 30 MG/ML IJ SOLN
INTRAMUSCULAR | Status: AC
  Filled 2021-09-02: qty 1

## 2021-09-02 MED ORDER — MIDAZOLAM HCL 2 MG/2ML IJ SOLN
INTRAMUSCULAR | Status: DC | PRN
  Administered 2021-09-02: 2 mg via INTRAVENOUS

## 2021-09-02 MED ORDER — FENTANYL CITRATE (PF) 100 MCG/2ML IJ SOLN
INTRAMUSCULAR | Status: DC | PRN
  Administered 2021-09-02: 100 ug via INTRAVENOUS

## 2021-09-02 MED ORDER — LIDOCAINE HCL (CARDIAC) PF 100 MG/5ML IV SOSY
PREFILLED_SYRINGE | INTRAVENOUS | Status: DC | PRN
  Administered 2021-09-02: 60 mg via INTRAVENOUS

## 2021-09-02 MED ORDER — KETOROLAC TROMETHAMINE 30 MG/ML IJ SOLN
INTRAMUSCULAR | Status: DC | PRN
  Administered 2021-09-02: 30 mg via INTRAVENOUS

## 2021-09-02 MED ORDER — ORAL CARE MOUTH RINSE: 15.0000 mL | Freq: Once | OROMUCOSAL | Status: AC

## 2021-09-02 MED ORDER — LIDOCAINE-EPINEPHRINE 2 %-1:100000 IJ SOLN
INTRAMUSCULAR | Status: AC
  Filled 2021-09-02: qty 3.4

## 2021-09-02 MED ORDER — AMISULPRIDE (ANTIEMETIC) 5 MG/2ML IV SOLN: 10.0000 mg | Freq: Once | INTRAVENOUS | Status: DC | PRN

## 2021-09-02 MED ORDER — LIDOCAINE HCL 2 % IJ SOLN
INTRAMUSCULAR | Status: DC | PRN
  Administered 2021-09-02: 09:00:00 1.7 mL via INTRADERMAL

## 2021-09-02 MED ORDER — SUCCINYLCHOLINE CHLORIDE 200 MG/10ML IV SOSY
PREFILLED_SYRINGE | INTRAVENOUS | Status: AC
  Filled 2021-09-02: qty 10

## 2021-09-02 MED ORDER — FENTANYL CITRATE (PF) 250 MCG/5ML IJ SOLN
INTRAMUSCULAR | Status: AC
  Filled 2021-09-02: qty 5

## 2021-09-02 MED ORDER — PHENYLEPHRINE 40 MCG/ML (10ML) SYRINGE FOR IV PUSH (FOR BLOOD PRESSURE SUPPORT)
PREFILLED_SYRINGE | INTRAVENOUS | Status: AC
  Filled 2021-09-02: qty 10

## 2021-09-02 SURGICAL SUPPLY — 19 items
CANISTER SUCT 3000ML PPV (MISCELLANEOUS) ×2 IMPLANT
COVER BACK TABLE 60X90IN (DRAPES) ×2 IMPLANT
COVER MAYO STAND STRL (DRAPES) ×2 IMPLANT
COVER SURGICAL LIGHT HANDLE (MISCELLANEOUS) ×2 IMPLANT
DRAPE HALF SHEET 40X57 (DRAPES) ×2 IMPLANT
GAUZE SPONGE 4X4 16PLY XRAY LF (GAUZE/BANDAGES/DRESSINGS) ×2 IMPLANT
GLOVE SURG ENC MOIS LTX SZ6.5 (GLOVE) ×2 IMPLANT
GOWN STRL REUS W/ TWL LRG LVL3 (GOWN DISPOSABLE) ×2 IMPLANT
GOWN STRL REUS W/TWL LRG LVL3 (GOWN DISPOSABLE) ×4
KIT BASIN OR (CUSTOM PROCEDURE TRAY) ×2 IMPLANT
KIT TURNOVER KIT B (KITS) ×2 IMPLANT
NDL DENTAL 27 LONG (NEEDLE) ×1 IMPLANT
NEEDLE DENTAL 27 LONG (NEEDLE) ×2 IMPLANT
PAD ARMBOARD 7.5X6 YLW CONV (MISCELLANEOUS) ×4 IMPLANT
SPONGE SURGIFOAM ABS GEL SZ50 (HEMOSTASIS) ×1 IMPLANT
TOWEL GREEN STERILE (TOWEL DISPOSABLE) ×2 IMPLANT
TUBE CONNECTING 12X1/4 (SUCTIONS) ×2 IMPLANT
WATER TABLETS ICX (MISCELLANEOUS) ×2 IMPLANT
YANKAUER SUCT BULB TIP NO VENT (SUCTIONS) ×2 IMPLANT

## 2021-09-02 NOTE — Transfer of Care (Signed)
Immediate Anesthesia Transfer of Care Note ? ?Patient: Justin Johnson ? ?Procedure(s) Performed: Full mouth cleaning and debridement, composite restoration on 3ML, 6MF, 23DB, extraction of tooth #21. Flouride varnish ? ?Patient Location: PACU ? ?Anesthesia Type:General ? ?Level of Consciousness: awake, alert  and oriented ? ?Airway & Oxygen Therapy: Patient Spontanous Breathing ? ?Post-op Assessment: Report given to RN and Post -op Vital signs reviewed and stable ? ?Post vital signs: Reviewed and stable ? ?Last Vitals:  ?Vitals Value Taken Time  ?BP 134/84 09/02/21 0909  ?Temp 37.4 ?C 09/02/21 0909  ?Pulse 74 09/02/21 0910  ?Resp 15 09/02/21 0910  ?SpO2 98 % 09/02/21 0910  ?Vitals shown include unvalidated device data. ? ?Last Pain:  ?Vitals:  ? 09/02/21 0909  ?TempSrc: Tympanic  ?PainSc:   ?   ? ?  ? ?Complications: No notable events documented. ?

## 2021-09-02 NOTE — H&P (Signed)
Anesthesia H&P Update: History and Physical Exam reviewed; patient is OK for planned anesthetic and procedure. ? ?

## 2021-09-02 NOTE — Op Note (Signed)
[facility] ? ?09/02/2021 ?Reather Converse ?[id number] ? ?Preop DX: Dental caries/behavior management issues due to intellectual disabilities/developmental disabilities. Dental Care provided in OR for medically necessary treatment. ? ?Surgeon: Joanna Hews, DMD ? ?Assistant: Shawnie Pons and hospital staff. ? ?Anesthesia: General ? ?Procedure: The patient was brought into the operating room and placed on the table in a supine position.  General anesthesia was administered via nasal intubation.  The patient was prepped and draped in the usual manner for an intra-oral general dentistry procedure. The oropharynx was suctioned and a moistened oropharyngeal throat pack was placed.  ?  ?A full intra-oral exam including all hard and soft tissues was performed.  ?Type of Exam: Recall  ? ?Soft Tissue Exam: ?Floor of the mouth: Normal ?Buccal mucosa: Normal ?Soft palate: Normal ?Hard palate: Normal ?Tongue: Normal ?Gingival: Normal ?Frenum: Normal ? ?Hard tissue exam:  ?Present: # 3,6-11,20-23, 25-28 ?Missing: # 1-2, 4-5,12-19,24,29-32 ?Radiographic findings decay: # 13M, 23D ?Radiographic findings abscess: # 21 ? ?Full mouth series of digital radiographs taken and reviewed. A comprehensive treatment plan was developed. ? ?Operative care was accomplished in a standard fashion using high/low speed drills with copious irrigation. ? ?Routine extractions were accomplished with simple elevation and use of forceps.  ? ? ?All surgical sites were irrigated with copious amounts of saline. Gel foam was placed in the sockets and hemostasis established with firm pressure. ?Local Anes:Lidocaine 2% with 1:100,000 epinephrine 1.7 mls ?The estimated blood loss was minimal. ? ?Upon completion of all procedures the oropharynx was irrigated of all debris. Mouth was suctioned dry and a posterior throat pack was carefully removed with constant suction. Hemostasis was established and a gauze pack was placed as an intraoral pressure dressing.  After spontaneous respirations the patient was extubated and transported to the Post-Anesthesia care unit in awake but in a sedated condition. The patient tolerated the procedure well and without complications.  An explanation of procedures and extractions were given to parents. ? ?Operative Procedures:  ?Full mouth debridement: Yes ?Extractions completed: # 21 ?Glass Ionomers: # 3ML(root/smooth/dentin), (coronal/smooth/dentin), 23DB (coronal/smooth/dentin) ?Fluoride varnish: Yes ? ? ?Postoperative Meds:  ?OTC ibuprofen 600mg  and tylenol 500mg  every 6 hours if needed for pain.  ? ?Postoperative Instructions: ?Extraction sheet signed and given to patient representative.  ? ? ? , DMD  ?

## 2021-09-02 NOTE — Anesthesia Procedure Notes (Signed)
Procedure Name: Intubation ?Date/Time: 09/02/2021 7:55 AM ?Performed by: Marquis Buggy, CRNA ?Pre-anesthesia Checklist: Patient identified, Emergency Drugs available, Suction available, Patient being monitored and Timeout performed ?Patient Re-evaluated:Patient Re-evaluated prior to induction ?Oxygen Delivery Method: Circle system utilized ?Preoxygenation: Pre-oxygenation with 100% oxygen ?Induction Type: IV induction ?Ventilation: Oral airway inserted - appropriate to patient size and Mask ventilation without difficulty ?Laryngoscope Size: Glidescope and 3 ?Grade View: Grade I ?Nasal Tubes: Left and Nasal Sheilah Pigeon ?Tube size: 7.0 mm ?Number of attempts: 1 ?Placement Confirmation: ETT inserted through vocal cords under direct vision, positive ETCO2, CO2 detector and breath sounds checked- equal and bilateral ?Tube secured with: Tape ?Dental Injury: Teeth and Oropharynx as per pre-operative assessment  ?Comments: [place] used ? ? ? ? ?

## 2021-09-02 NOTE — Anesthesia Postprocedure Evaluation (Signed)
Anesthesia Post Note ? ?Patient: KADIEN LINEMAN ? ?Procedure(s) Performed: Full mouth cleaning and debridement, composite restoration on , , 23DB, extraction of tooth #21. Flouride varnish ? ?  ? ?Patient location during evaluation: PACU ?Anesthesia Type: General ?Level of consciousness: sedated and patient cooperative ?Pain management: pain level controlled ?Vital Signs Assessment: post-procedure vital signs reviewed and stable ?Respiratory status: spontaneous breathing ?Cardiovascular status: stable ?Anesthetic complications: no ? ? ?No notable events documented. ? ?Last Vitals:  ?Vitals:  ? 09/02/21 0920 09/02/21 0935  ?BP: 133/77 128/79  ?Pulse: 75 65  ?Resp: 13 12  ?Temp:  (!) 36.1 ?C  ?SpO2: 99% 97%  ?  ?Last Pain:  ?Vitals:  ? 09/02/21 0935  ?TempSrc:   ?PainSc: 0-No pain  ? ? ?  ?  ?  ?  ?  ?  ? ?Lewie Loron ? ? ? ? ?

## 2021-09-02 NOTE — H&P (Signed)
H&P reviewed. Stable for surgery Jarek Longton H Kadisha Goodine, DMD  

## 2021-09-02 NOTE — Brief Op Note (Signed)
09/02/2021 ? ?8:54 AM ? ?PATIENT:  Justin Johnson  51 y.o. male ? ?PRE-OPERATIVE DIAGNOSIS:  DENTAL CARIES ? ?POST-OPERATIVE DIAGNOSIS:  DENTAL CARIES ? ?PROCEDURE:  Procedure(s): ?Full mouth cleaning and debridement, composite restoration on 3ML, 6MF, 23DB, extraction of tooth #21. Flouride varnish (N/A) ? ?SURGEON:  Surgeon(s) and Role: ?   * Link Snuffer, Marcie Bal, DMD - Primary ? ?PHYSICIAN ASSISTANT:  ? ?ASSISTANTS: Rayburn Ma and hospital staff  ? ?ANESTHESIA:   general ? ?EBL:  minimal  ? ?BLOOD ADMINISTERED:none ? ?DRAINS: none  ? ?LOCAL MEDICATIONS USED:  LIDOCAINE  and Amount: 1.7 ml ? ?SPECIMEN:  No Specimen ? ?DISPOSITION OF SPECIMEN:  N/A ? ?COUNTS:  Yes ? ?TOURNIQUET:  * No tourniquets in log * ? ?DICTATION: .Dragon Dictation ? ?PLAN OF CARE: Discharge to home after PACU ? ?PATIENT DISPOSITION:  PACU - hemodynamically stable. ?  ?Delay start of Pharmacological VTE agent (>24hrs) due to surgical blood loss or risk of bleeding: not applicable ? ?

## 2021-09-03 ENCOUNTER — Encounter (HOSPITAL_COMMUNITY): Payer: Self-pay | Admitting: Dentistry

## 2022-01-03 ENCOUNTER — Encounter (HOSPITAL_BASED_OUTPATIENT_CLINIC_OR_DEPARTMENT_OTHER): Payer: Self-pay

## 2022-01-03 ENCOUNTER — Other Ambulatory Visit: Payer: Self-pay

## 2022-01-03 ENCOUNTER — Emergency Department (HOSPITAL_BASED_OUTPATIENT_CLINIC_OR_DEPARTMENT_OTHER)
Admission: EM | Admit: 2022-01-03 | Discharge: 2022-01-03 | Disposition: A | Discharge status: Home / Self Care | Payer: Medicare Other | Attending: Emergency Medicine | Admitting: Emergency Medicine

## 2022-01-03 DIAGNOSIS — R1110 Vomiting, unspecified: Secondary | ICD-10-CM | POA: Insufficient documentation

## 2022-01-03 DIAGNOSIS — R066 Hiccough: Secondary | ICD-10-CM

## 2022-01-03 LAB — BASIC METABOLIC PANEL
Anion gap: 12 (ref 5–15)
BUN: 17 mg/dL (ref 6–20)
CO2: 28 mmol/L (ref 22–32)
Calcium: 9.9 mg/dL (ref 8.9–10.3)
Chloride: 100 mmol/L (ref 98–111)
Creatinine, Ser: 1.01 mg/dL (ref 0.61–1.24)
GFR, Estimated: >60 mL/min (ref >60)
Glucose, Bld: 96 mg/dL (ref 70–99)
Potassium: 4.1 mmol/L (ref 3.5–5.1)
Sodium: 140 mmol/L (ref 135–145)

## 2022-01-03 MED ORDER — CHLORPROMAZINE HCL 25 MG PO TABS
25.0000 mg | ORAL_TABLET | Freq: Once | ORAL | Status: AC
  Administered 2022-01-03: 25 mg via ORAL
  Filled 2022-01-03: qty 1

## 2022-01-03 MED ORDER — SODIUM CHLORIDE 0.9 % IV SOLN
12.5000 mg | INTRAVENOUS | Status: DC
  Filled 2022-01-03: qty 0.5

## 2022-01-03 MED ORDER — DROPERIDOL 2.5 MG/ML IJ SOLN: 1.2500 mg | Freq: Once | INTRAMUSCULAR | Status: DC

## 2022-01-03 MED ORDER — DROPERIDOL 2.5 MG/ML IJ SOLN
1.2500 mg | Freq: Once | INTRAMUSCULAR | Status: AC
  Administered 2022-01-03: 1.25 mg via INTRAVENOUS
  Filled 2022-01-03: qty 2

## 2022-01-03 MED ORDER — LACTATED RINGERS IV SOLN: INTRAVENOUS | Status: DC

## 2022-01-03 MED ORDER — PANTOPRAZOLE SODIUM 40 MG IV SOLR
40.0000 mg | Freq: Once | INTRAVENOUS | Status: AC
  Administered 2022-01-03: 40 mg via INTRAVENOUS
  Filled 2022-01-03: qty 10

## 2022-01-03 MED ORDER — LACTATED RINGERS IV BOLUS
1000.0000 mL | Freq: Once | INTRAVENOUS | Status: AC
  Administered 2022-01-03: 1000 mL via INTRAVENOUS

## 2022-01-03 NOTE — ED Provider Notes (Addendum)
[facility] EMERGENCY DEPT Provider Note   CSN: [id number] Arrival date & time: 01/03/22  5643     History  Chief Complaint  Patient presents with   Hiccups   Nausea   Emesis    Justin Johnson is a 51 y.o. male.  51 year old male presents with recurrent hiccups.  According to parents, patient has had this issue for quite some time.  Seen in the ED before in the past for this.  Was prescribed Thorazine.  Parent states that he has been hiccuping so much that he has had emesis associated with this.  Denies any fever or abdominal discomfort.  Has not used any Thorazine today but did use Protonix.       Home Medications Prior to Admission medications   Medication Sig Start Date End Date Taking? Authorizing Provider  ARIPiprazole (ABILIFY) 2 MG tablet Take 2 mg by mouth 2 (two) times daily. (0800 & 2000)    [provider]  chlorproMAZINE (THORAZINE) 25 MG tablet Take 1 tablet (25 mg total) by mouth 3 (three) times daily as needed for hiccoughs. Patient taking differently: Take 25 mg by mouth 2 (two) times daily as needed for hiccoughs. For two days if still has hiccups, take to ER. Notify nursing, obtain O2 with pulse OX if blush color if found or seen in nail bed. 02/01/15   Pollina, Canary Brim, MD  Cholecalciferol (VITAMIN D3) 50 MCG (2000 UT) TABS Take 2,000 Units by mouth daily.    [provider]  EPINEPHrine 0.3 mg/0.3 mL IJ SOAJ injection Inject 0.3 mg into the muscle as needed for anaphylaxis.    [provider]  famotidine (PEPCID) 40 MG/5ML suspension Take 20 mg by mouth every evening. Given at 20:00    [provider]  lactulose (CHRONULAC) 10 GM/15ML solution Take 10 g by mouth at bedtime. Given at 20:00    [provider]  levothyroxine (SYNTHROID, LEVOTHROID) 50 MCG tablet Take 50 mcg by mouth daily before breakfast. (630) at the time prior to breakfast on an empty stomach with water only for Hypothyroidism *Wait  30-60 minutes before eating or Drinking anything other than water    [provider]  NIFEdipine (PROCARDIA) 20 MG capsule Take 20 mg by mouth 2 (two) times daily. Hold for BP below 90/60    [provider]  omeprazole (PRILOSEC) 20 MG capsule Take 1 capsule (20 mg total) by mouth daily. Patient taking differently: Take 20 mg by mouth 2 (two) times daily before a meal. 03/07/15   Everlene Farrier, PA-C  omeprazole (PRILOSEC) 20 MG capsule Take 20 mg by mouth as needed (For Hiccups greater than or equal to 5 minutes for 1 dose/24 hours call Nursing prior to administration).    [provider]  SODIUM FLUORIDE 5000 PLUS 1.1 % CREA dental cream Place 1 application. onto teeth every evening. Spit out excess and do not rinse 05/12/21   [provider]  tamsulosin (FLOMAX) 0.4 MG CAPS capsule Take 0.4 mg by mouth daily. 08/09/21   [provider]  triazolam (HALCION) 0.125 MG tablet 0.375 mg See admin instructions. By mouth under the directions of the access dental care staff    [provider]      Allergies    Bee venom    Review of Systems   Review of Systems  All other systems reviewed and are negative.   Physical Exam Updated Vital Signs BP 126/86 (BP Location: Right Arm)   Pulse 99  Temp 98.1 F (36.7 C)   Resp 17   Ht 1.549 m (5\' 1" )   Wt 67.6 kg   SpO2 100%   BMI 28.15 kg/m  Physical Exam Vitals and nursing note reviewed.  Constitutional:      General: He is not in acute distress.    Appearance: Normal appearance. He is well-developed. He is not toxic-appearing.  HENT:     Head: Normocephalic and atraumatic.  Eyes:     General: Lids are normal.     Conjunctiva/sclera: Conjunctivae normal.     Pupils: Pupils are equal, round, and reactive to light.  Neck:     Thyroid: No thyroid mass.     Trachea: No tracheal deviation.  Cardiovascular:     Rate and Rhythm: Normal rate and regular rhythm.     Heart sounds: Normal heart  sounds. No murmur heard.    No gallop.  Pulmonary:     Effort: Pulmonary effort is normal. No respiratory distress.     Breath sounds: Normal breath sounds. No stridor. No decreased breath sounds, wheezing, rhonchi or rales.  Abdominal:     General: There is no distension.     Palpations: Abdomen is soft.     Tenderness: There is no abdominal tenderness. There is no rebound.  Musculoskeletal:        General: No tenderness. Normal range of motion.     Cervical back: Normal range of motion and neck supple.  Skin:    General: Skin is warm and dry.     Findings: No abrasion or rash.  Neurological:     Mental Status: He is alert and oriented to person, place, and time. Mental status is at baseline.     GCS: GCS eye subscore is 4. GCS verbal subscore is 5. GCS motor subscore is 6.     Cranial Nerves: No cranial nerve deficit.     Sensory: No sensory deficit.     Motor: Motor function is intact.  Psychiatric:        Attention and Perception: Attention normal.        Speech: Speech normal.        Behavior: Behavior normal.     ED Results / Procedures / Treatments   Labs (all labs ordered are listed, but only abnormal results are displayed) Labs Reviewed  BASIC METABOLIC PANEL    EKG None  Radiology No results found.  Procedures Procedures    Medications Ordered in ED Medications  lactated ringers bolus 1,000 mL (has no administration in time range)  lactated ringers infusion (has no administration in time range)  droperidol (INAPSINE) 2.5 MG/ML injection 1.25 mg (has no administration in time range)    ED Course/ Medical Decision Making/ A&P                           Medical Decision Making Amount and/or Complexity of Data Reviewed Labs: ordered.  Risk Prescription drug management.  Patient given IV Protonix as well. Patient electrolytes within normal limits here.  Concern for dehydration he was given IV fluids.  He was given multiple rounds of medication for  his hiccups but there is still remaining.  Patient has no abdominal pain here at this time.  He has not had any shortness of breath.  Low suspicion for intra-abdominal etiology or diaphragmatic issues related to his hiccups.  He has had no emesis here.  He can take his oral medications here.  Family  was instructed to continue Thorazine as directed and follow-up with his doctor as needed        Final Clinical Impression(s) / ED Diagnoses Final diagnoses:  None    Rx / DC Orders ED Discharge Orders     None         Lorre Nick, MD 01/03/22 1309    Lorre Nick, MD 01/03/22 1310

## 2022-01-03 NOTE — ED Triage Notes (Signed)
Pt arrives to ED with parents. Mother states patient has had really bad hiccups since Wednesday. Pt did vomit several times yesterday and last night.

## 2024-04-01 ENCOUNTER — Ambulatory Visit: Payer: Self-pay

## 2024-04-01 NOTE — Telephone Encounter (Signed)
 Erroneous Encounter
